# Patient Record
Sex: Male | Born: 1970 | Race: Black or African American | Hispanic: No | Smoking: Former smoker
Health system: Southern US, Community
[De-identification: ages and names within clinical notes are randomized; demographics above are authoritative.]

## PROBLEM LIST (undated history)

## (undated) DIAGNOSIS — I10 Essential (primary) hypertension: Secondary | ICD-10-CM

## (undated) DIAGNOSIS — K649 Unspecified hemorrhoids: Secondary | ICD-10-CM

## (undated) DIAGNOSIS — K317 Polyp of stomach and duodenum: Secondary | ICD-10-CM

## (undated) HISTORY — DX: Unspecified hemorrhoids: K64.9

## (undated) HISTORY — DX: Polyp of stomach and duodenum: K31.7

## (undated) HISTORY — PX: HERNIA REPAIR: SHX51

## (undated) HISTORY — PX: HYDROCELE EXCISION: SHX482

---

## 2012-08-25 ENCOUNTER — Emergency Department (HOSPITAL_BASED_OUTPATIENT_CLINIC_OR_DEPARTMENT_OTHER)
Admission: EM | Admit: 2012-08-25 | Discharge: 2012-08-25 | Disposition: A | Discharge status: Home / Self Care | Payer: Self-pay | Attending: Emergency Medicine | Admitting: Emergency Medicine

## 2012-08-25 ENCOUNTER — Encounter (HOSPITAL_BASED_OUTPATIENT_CLINIC_OR_DEPARTMENT_OTHER): Payer: Self-pay | Admitting: *Deleted

## 2012-08-25 ENCOUNTER — Emergency Department (HOSPITAL_BASED_OUTPATIENT_CLINIC_OR_DEPARTMENT_OTHER): Payer: Self-pay

## 2012-08-25 DIAGNOSIS — M65849 Other synovitis and tenosynovitis, unspecified hand: Secondary | ICD-10-CM | POA: Insufficient documentation

## 2012-08-25 DIAGNOSIS — M2548 Effusion, other site: Secondary | ICD-10-CM | POA: Insufficient documentation

## 2012-08-25 DIAGNOSIS — M65839 Other synovitis and tenosynovitis, unspecified forearm: Secondary | ICD-10-CM | POA: Insufficient documentation

## 2012-08-25 DIAGNOSIS — M2550 Pain in unspecified joint: Secondary | ICD-10-CM | POA: Insufficient documentation

## 2012-08-25 DIAGNOSIS — M778 Other enthesopathies, not elsewhere classified: Secondary | ICD-10-CM

## 2012-08-25 MED ORDER — IBUPROFEN 800 MG PO TABS: 800.0000 mg | ORAL_TABLET | Freq: Three times a day (TID) | ORAL | Status: DC

## 2012-08-25 NOTE — ED Notes (Signed)
Pt c/o left wrist/hand injury x 2 days ago

## 2012-08-25 NOTE — ED Provider Notes (Signed)
History     CSN: [id number]  Arrival date & time 08/25/12  1300   First MD Initiated Contact with Patient 08/25/12 1443      Chief Complaint  Patient presents with  . Wrist Pain    (Consider location/radiation/quality/duration/timing/severity/associated sxs/prior treatment) HPI Comments: Colin Cole, who is ambidextrous,  presents with a 2 day history of left thumb and wrist pain since working on his car.  He denies any specific injury but reports slight swelling at the base of his thumb and increased pain with attempts to make a fist or extend the left thumb, mostly,  But also the index finger.  He describes left wrist fracture from 2006 which has not caused discomfort in several years.  He has taken no medications prior to arrival.  He denies numbness distal to the site of pain.  The history is provided by the patient.    History reviewed. No pertinent past medical history.  Past Surgical History  Procedure Date  . Hernia repair     History reviewed. No pertinent family history.  History  Substance Use Topics  . Smoking status: Never Smoker   . Smokeless tobacco: Not on file  . Alcohol Use: No      Review of Systems  Musculoskeletal: Positive for joint swelling and arthralgias.  Skin: Negative for wound.  Neurological: Negative for weakness and numbness.    Allergies  Asa  Home Medications   Current Outpatient Rx  Name  Route  Sig  Dispense  Refill  . IBUPROFEN 800 MG PO TABS   Oral   Take 1 tablet (800 mg total) by mouth 3 (three) times daily.   21 tablet   0     BP 152/96  Pulse 78  Temp 98.5 F (36.9 C) (Oral)  Resp 16  Ht 6\' 1"  (1.854 m)  Wt 250 lb (113.399 kg)  BMI 32.98 kg/m2  SpO2 100%  Physical Exam  Constitutional: He appears well-developed and well-nourished.  HENT:  Head: Atraumatic.  Neck: Normal range of motion.  Cardiovascular:       Pulses equal bilaterally  Musculoskeletal: He exhibits edema. He exhibits no tenderness.       Left hand: He exhibits normal capillary refill and no deformity.       Hands:      Patient has no ttp with palpation of the left finger or hand.  He does have pain along the left thumb extensor tendon which is worsened with resistance.  No erythema, slight edema noted.  No skin abrasions or visible injury.  Neurological: He is alert. He has normal strength. He displays normal reflexes. No sensory deficit.       Equal strength  Skin: Skin is warm and dry.  Psychiatric: He has a normal mood and affect.    ED Course  Procedures (including critical care time)  Labs Reviewed - No data to display Dg Wrist Complete Left  08/25/2012  *RADIOLOGY REPORT*  Clinical Data: Left wrist pain for 2 days  LEFT WRIST - COMPLETE 3+ VIEW  Comparison: None.  Findings: Four views of the left wrist submitted.  No acute fracture or subluxation.  Small bony fragment adjacent to tip of distal radius is well corticated probable due to prior injury.  IMPRESSION: No acute fracture or subluxation.   Original Report Authenticated By: Natasha Mead, M.D.      1. Tendonitis of wrist, left       MDM  xrays reviewed.  Pt placed in thumb  spica, encouraged heat tx,  Ibuprofen which was prescribed.  Fu with pcp or Dr. Pearletha Forge,  Referral given,  If not improved over the next week.        Burgess Amor, [place] 08/25/12 1630

## 2012-08-27 NOTE — ED Provider Notes (Signed)
Medical screening examination/treatment/procedure(s) were performed by non-physician practitioner and as supervising physician I was immediately available for consultation/collaboration.  Juliet Rude. Rubin Payor, MD 08/27/12 [phone]

## 2013-02-18 ENCOUNTER — Emergency Department (HOSPITAL_BASED_OUTPATIENT_CLINIC_OR_DEPARTMENT_OTHER)
Admission: EM | Admit: 2013-02-18 | Discharge: 2013-02-18 | Disposition: A | Discharge status: Home Health | Payer: Medicaid Other | Attending: Emergency Medicine | Admitting: Emergency Medicine

## 2013-02-18 ENCOUNTER — Encounter (HOSPITAL_BASED_OUTPATIENT_CLINIC_OR_DEPARTMENT_OTHER): Payer: Self-pay | Admitting: Emergency Medicine

## 2013-02-18 ENCOUNTER — Encounter (HOSPITAL_BASED_OUTPATIENT_CLINIC_OR_DEPARTMENT_OTHER): Payer: Self-pay | Admitting: Family Medicine

## 2013-02-18 ENCOUNTER — Emergency Department (HOSPITAL_BASED_OUTPATIENT_CLINIC_OR_DEPARTMENT_OTHER)
Admission: EM | Admit: 2013-02-18 | Discharge: 2013-02-19 | Disposition: A | Discharge status: Home / Self Care | Payer: Medicaid Other | Attending: Emergency Medicine | Admitting: Emergency Medicine

## 2013-02-18 DIAGNOSIS — B86 Scabies: Secondary | ICD-10-CM | POA: Insufficient documentation

## 2013-02-18 DIAGNOSIS — L299 Pruritus, unspecified: Secondary | ICD-10-CM | POA: Insufficient documentation

## 2013-02-18 DIAGNOSIS — T3795XA Adverse effect of unspecified systemic anti-infective and antiparasitic, initial encounter: Secondary | ICD-10-CM | POA: Insufficient documentation

## 2013-02-18 DIAGNOSIS — Z8619 Personal history of other infectious and parasitic diseases: Secondary | ICD-10-CM | POA: Insufficient documentation

## 2013-02-18 DIAGNOSIS — T7840XA Allergy, unspecified, initial encounter: Secondary | ICD-10-CM

## 2013-02-18 MED ORDER — PERMETHRIN 5 % EX CREA: TOPICAL_CREAM | CUTANEOUS | Status: DC

## 2013-02-18 NOTE — ED Notes (Signed)
Pt states itching which started last night; states hands are swollen; states rash to back, hands and  Arms. States he was bitten by bugs. No distinct rash or bug bites visible.

## 2013-02-18 NOTE — ED Provider Notes (Signed)
   History    CSN: [id number] Arrival date & time 02/18/13  2159  First MD Initiated Contact with Patient 02/18/13 2356     Chief Complaint  Patient presents with  . Allergic Reaction   (Consider location/radiation/quality/duration/timing/severity/associated sxs/prior Treatment) HPI This is a 42 year old male who was seen here earlier today for a rash and was diagnosed as scabies. He was prescribed Prometrium. He applied the Prometrium as instructed between 5 and 6 PM. About an hour later he developed generalized itching, primarily in his hands. He did not take anything for the itching but did run his hands under hot water which helped him transiently. He continues to have generalized itching which is mild to moderate severity. Is also having some generalized hives. He denies any breathing difficulty, throat swelling, nausea, vomiting or diarrhea.  History reviewed. No pertinent past medical history. Past Surgical History  Procedure Laterality Date  . Hernia repair     History reviewed. No pertinent family history. History  Substance Use Topics  . Smoking status: Never Smoker   . Smokeless tobacco: Not on file  . Alcohol Use: Yes    Review of Systems  All other systems reviewed and are negative.    Allergies  Asa  Home Medications   Current Outpatient Rx  Name  Route  Sig  Dispense  Refill  . permethrin (ELIMITE) 5 % cream      Apply to entire body once, leave on for 8-14hrs before washing off.   60 g   0   . ibuprofen (ADVIL,MOTRIN) 800 MG tablet   Oral   Take 1 tablet (800 mg total) by mouth 3 (three) times daily.   21 tablet   0    BP 142/101  Pulse 76  Temp(Src) 97.9 F (36.6 C) (Oral)  Resp 20  SpO2 97%  Physical Exam General: Well-developed, well-nourished male in no acute distress; appearance consistent with age of record HENT: normocephalic, atraumatic Eyes: pupils equal round and reactive to light; extraocular muscles intact Neck: supple Heart:  regular rate and rhythm Lungs: clear to auscultation bilaterally Abdomen: soft; nondistended; nontender; bowel sounds present Extremities: No deformity; full range of motion Neurologic: Awake, alert and oriented; motor function intact in all extremities and symmetric; no facial droop Skin: Warm and dry; scattered sparse urticaria; no scabiform rash appreciated Psychiatric: Normal mood and affect    ED Course  Procedures (including critical care time)   MDM  Suspect allergic reaction to the permethrin. He was advised to wash it off at 2 AM to assure treatment of scabies. We will treat with antihistamine for symptoms.  Hanley Seamen, MD 02/19/13 [phone]

## 2013-02-18 NOTE — ED Provider Notes (Signed)
   History    CSN: [id number] Arrival date & time 02/18/13  1036  First MD Initiated Contact with Patient 02/18/13 1118     Chief Complaint  Patient presents with  . Rash   (Consider location/radiation/quality/duration/timing/severity/associated sxs/prior Treatment) Patient is a 42 y.o. male presenting with rash.  Rash  Pt reports itchy rash on hands since yesterday. Mostly in the webspaces, has also noticed itching in other areas that has since resolved. He was working outside yesterday on a car, and reports multiple mosquitoes around, but unsure if he was bitten.   History reviewed. No pertinent past medical history. Past Surgical History  Procedure Laterality Date  . Hernia repair     No family history on file. History  Substance Use Topics  . Smoking status: Never Smoker   . Smokeless tobacco: Not on file  . Alcohol Use: Yes    Review of Systems  Skin: Positive for rash.   All other systems reviewed and are negative except as noted in HPI.   Allergies  Asa  Home Medications   Current Outpatient Rx  Name  Route  Sig  Dispense  Refill  . ibuprofen (ADVIL,MOTRIN) 800 MG tablet   Oral   Take 1 tablet (800 mg total) by mouth 3 (three) times daily.   21 tablet   0    BP 156/89  Pulse 86  Temp(Src) 98.3 F (36.8 C) (Oral)  Resp 20  SpO2 98% Physical Exam  Constitutional: He is oriented to person, place, and time. He appears well-developed and well-nourished.  HENT:  Head: Normocephalic and atraumatic.  Neck: Neck supple.  Pulmonary/Chest: Effort normal.  Neurological: He is alert and oriented to person, place, and time. No cranial nerve deficit.  Skin: No rash noted.  No definite burros, but location is concerning for scabies  Psychiatric: He has a normal mood and affect. His behavior is normal.    ED Course  Procedures (including critical care time) Labs Reviewed - No data to display No results found. 1. Scabies     MDM  Plan scabies treatment  at home. Return for any worsening itching or new rash.   Yichen B. Bernette Mayers, MD 02/18/13 1136

## 2013-02-18 NOTE — ED Notes (Signed)
Pt was seen earlier today for what he thought were mosquito bites, was dx with scabes, given permethrin cream, pt used cream today and rash got worse, now presents with whelps down both arms, both legs and torso

## 2013-02-18 NOTE — ED Notes (Signed)
Pt c/o rash to hands and sts he thinks "insects bit him".

## 2013-02-19 MED ORDER — CETIRIZINE HCL 10 MG PO TABS: ORAL_TABLET | ORAL | Status: DC

## 2013-02-19 MED ORDER — CETIRIZINE HCL 5 MG/5ML PO SYRP
10.0000 mg | ORAL_SOLUTION | Freq: Once | ORAL | Status: AC
  Administered 2013-02-19: 10 mg via ORAL
  Filled 2013-02-19: qty 10

## 2013-02-19 MED ORDER — FAMOTIDINE 20 MG PO TABS
20.0000 mg | ORAL_TABLET | Freq: Once | ORAL | Status: AC
  Administered 2013-02-19: 20 mg via ORAL
  Filled 2013-02-19: qty 1

## 2013-02-19 NOTE — ED Notes (Signed)
MD at bedside. 

## 2013-05-11 ENCOUNTER — Ambulatory Visit (INDEPENDENT_AMBULATORY_CARE_PROVIDER_SITE_OTHER): Payer: Medicaid Other | Admitting: Podiatry

## 2013-05-11 ENCOUNTER — Encounter: Payer: Self-pay | Admitting: Podiatry

## 2013-05-11 VITALS — BP 140/83 | HR 70 | Ht 73.0 in | Wt 281.0 lb

## 2013-05-11 DIAGNOSIS — M79673 Pain in unspecified foot: Secondary | ICD-10-CM | POA: Insufficient documentation

## 2013-05-11 DIAGNOSIS — L600 Ingrowing nail: Secondary | ICD-10-CM

## 2013-05-11 DIAGNOSIS — M79609 Pain in unspecified limb: Secondary | ICD-10-CM

## 2013-05-11 DIAGNOSIS — B351 Tinea unguium: Secondary | ICD-10-CM

## 2013-05-11 NOTE — Patient Instructions (Addendum)
Seen for painful ingrown nails. Today all nails debrided and relieved pressure. May benefit from permanent procedure on right great toe lateral border. Set up appointment for the nail procedure.

## 2013-05-11 NOTE — Progress Notes (Signed)
Subjective: 42 y.o. year old male patient presents complaining of painful nails. Patient requests toe nails, corns and calluses trimmed.   Review of Systems - General ROS: negative for - chills, fatigue, fever, night sweats, sleep disturbance, weight gain or weight loss Ophthalmic ROS: Eyes are blurring. He will get them examined. ENT ROS: Sinus problems. 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 Neurological ROS: no TIA or stroke symptoms Dermatological ROS: negative.  Objective: Dermatologic: Thick yellow deformed nails are growing in on both great toes. Right great toe is painful. There is no open skin and no erythema or edema.  Vascular: Pedal pulses are all palpable. No edema or erythema. Orthopedic: Bilateral bunion deformity. Neurologic: All epicritic and tactile sensations grossly intact.  Assessment: Dystrophic mycotic nails x 10. Ingrown nail both great toe with pain on right lateral border.  Treatment: All mycotic nails debrided. Pressure and pain was relieved. May return for nail procedure P&A on right lateral border.

## 2013-05-17 ENCOUNTER — Encounter: Payer: Self-pay | Admitting: Podiatry

## 2013-05-17 ENCOUNTER — Ambulatory Visit (INDEPENDENT_AMBULATORY_CARE_PROVIDER_SITE_OTHER): Payer: Medicaid Other | Admitting: Podiatry

## 2013-05-17 VITALS — BP 138/84 | HR 73 | Ht 73.0 in | Wt 281.0 lb

## 2013-05-17 DIAGNOSIS — L600 Ingrowing nail: Secondary | ICD-10-CM

## 2013-05-17 DIAGNOSIS — M79609 Pain in unspecified limb: Secondary | ICD-10-CM

## 2013-05-17 NOTE — Patient Instructions (Addendum)
Permanent ingrown nail surgery done on right great toe lateral border. Follow instruction sheet and soak daily. Return next week.

## 2013-05-17 NOTE — Progress Notes (Signed)
42 year old male presents to have ingrown nail surgery right great toe. Consent form reviewed.  Procedure: Phenol and Alcohol matrixectomy right great toe lateral border.  Right great toe anesthetized with mixture of 1% Xylocaine with epinephrine and 0.5% Marcaine plain total 5ml. Lateral border nail removed and cauterized nail with phenol. Area neutralized with Alcohol and Amerigel ointment applied. Written and verbal instruction given to patient.

## 2013-05-24 ENCOUNTER — Encounter: Payer: Self-pay | Admitting: Podiatry

## 2013-05-24 ENCOUNTER — Ambulatory Visit (INDEPENDENT_AMBULATORY_CARE_PROVIDER_SITE_OTHER): Payer: Medicaid Other | Admitting: Podiatry

## 2013-05-24 VITALS — BP 145/89 | HR 75 | Ht 73.0 in | Wt 278.0 lb

## 2013-05-24 DIAGNOSIS — L600 Ingrowing nail: Secondary | ICD-10-CM

## 2013-05-24 NOTE — Progress Notes (Signed)
1 week follow up on right great toe ingrown nail surgery. Came in with fresh dressing on right great toe. Healing well without infection or inflammation. Continue to soak till pain stops.

## 2013-05-24 NOTE — Patient Instructions (Signed)
Follow up on nail surgery right great toe. Seen no sign of infection or inflammation. Continue to soak till pain stops.

## 2013-05-27 ENCOUNTER — Telehealth: Payer: Self-pay | Admitting: *Deleted

## 2013-05-27 NOTE — Telephone Encounter (Signed)
Dr. Verlee Rossetti called this am requesting could you call in Terbinafine for foot fungus. He gets his med at [facility] and [facility]

## 2013-06-01 ENCOUNTER — Telehealth: Payer: Self-pay | Admitting: *Deleted

## 2013-06-01 MED ORDER — TERBINAFINE HCL 1 % EX SOLN: 1.0000 [drp] | Freq: Every evening | CUTANEOUS | Status: DC

## 2013-06-01 NOTE — Telephone Encounter (Signed)
Call from [facility] regarding Mr. Gillen Rx for Terbinafine, doesn't come in solution, only in cream, tablet or spray. Told Pharmacist to use cream.

## 2013-08-11 DIAGNOSIS — K317 Polyp of stomach and duodenum: Secondary | ICD-10-CM

## 2013-08-11 HISTORY — DX: Polyp of stomach and duodenum: K31.7

## 2013-10-30 ENCOUNTER — Encounter (HOSPITAL_BASED_OUTPATIENT_CLINIC_OR_DEPARTMENT_OTHER): Payer: Self-pay | Admitting: Emergency Medicine

## 2013-10-30 ENCOUNTER — Emergency Department (HOSPITAL_BASED_OUTPATIENT_CLINIC_OR_DEPARTMENT_OTHER)
Admission: EM | Admit: 2013-10-30 | Discharge: 2013-10-30 | Disposition: A | Discharge status: Home / Self Care | Payer: No Typology Code available for payment source | Attending: Emergency Medicine | Admitting: Emergency Medicine

## 2013-10-30 DIAGNOSIS — M546 Pain in thoracic spine: Secondary | ICD-10-CM | POA: Insufficient documentation

## 2013-10-30 DIAGNOSIS — R51 Headache: Secondary | ICD-10-CM | POA: Insufficient documentation

## 2013-10-30 DIAGNOSIS — M62838 Other muscle spasm: Secondary | ICD-10-CM

## 2013-10-30 DIAGNOSIS — I10 Essential (primary) hypertension: Secondary | ICD-10-CM | POA: Insufficient documentation

## 2013-10-30 DIAGNOSIS — Z79899 Other long term (current) drug therapy: Secondary | ICD-10-CM | POA: Insufficient documentation

## 2013-10-30 DIAGNOSIS — G8911 Acute pain due to trauma: Secondary | ICD-10-CM | POA: Insufficient documentation

## 2013-10-30 DIAGNOSIS — Z87891 Personal history of nicotine dependence: Secondary | ICD-10-CM | POA: Insufficient documentation

## 2013-10-30 HISTORY — DX: Essential (primary) hypertension: I10

## 2013-10-30 MED ORDER — NAPROXEN 500 MG PO TABS: 500.0000 mg | ORAL_TABLET | Freq: Two times a day (BID) | ORAL | Status: DC

## 2013-10-30 MED ORDER — CYCLOBENZAPRINE HCL 10 MG PO TABS: 10.0000 mg | ORAL_TABLET | Freq: Two times a day (BID) | ORAL | Status: DC | PRN

## 2013-10-30 NOTE — Discharge Instructions (Signed)
If you were given medicines take as directed.  If you are on coumadin or contraceptives realize their levels and effectiveness is altered by many different medicines.  If you have any reaction (rash, tongues swelling, other) to the medicines stop taking and see a physician.   Please follow up as directed and return to the ER or see a physician for new or worsening symptoms.  Thank you. Filed Vitals:   10/30/13 1136  BP: 161/111  Pulse: 88  Temp: 98.6 F (37 C)  TempSrc: Oral  Resp: 18  Height: 6\' 1"  (1.854 m)  Weight: 270 lb (122.471 kg)  SpO2: 94%   Do not take both ibuprofen and naproxen as they work the same way.
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
## 2014-05-27 ENCOUNTER — Encounter (HOSPITAL_BASED_OUTPATIENT_CLINIC_OR_DEPARTMENT_OTHER): Payer: Self-pay | Admitting: Emergency Medicine

## 2014-05-27 ENCOUNTER — Emergency Department (HOSPITAL_BASED_OUTPATIENT_CLINIC_OR_DEPARTMENT_OTHER): Payer: Medicaid Other

## 2014-05-27 ENCOUNTER — Emergency Department (HOSPITAL_BASED_OUTPATIENT_CLINIC_OR_DEPARTMENT_OTHER)
Admission: EM | Admit: 2014-05-27 | Discharge: 2014-05-27 | Disposition: A | Discharge status: Home / Self Care | Payer: Medicaid Other | Attending: Emergency Medicine | Admitting: Emergency Medicine

## 2014-05-27 DIAGNOSIS — K625 Hemorrhage of anus and rectum: Secondary | ICD-10-CM

## 2014-05-27 DIAGNOSIS — Z7952 Long term (current) use of systemic steroids: Secondary | ICD-10-CM | POA: Insufficient documentation

## 2014-05-27 DIAGNOSIS — Z79899 Other long term (current) drug therapy: Secondary | ICD-10-CM | POA: Insufficient documentation

## 2014-05-27 DIAGNOSIS — K921 Melena: Secondary | ICD-10-CM | POA: Insufficient documentation

## 2014-05-27 DIAGNOSIS — Z791 Long term (current) use of non-steroidal anti-inflammatories (NSAID): Secondary | ICD-10-CM | POA: Insufficient documentation

## 2014-05-27 DIAGNOSIS — I10 Essential (primary) hypertension: Secondary | ICD-10-CM | POA: Insufficient documentation

## 2014-05-27 DIAGNOSIS — Z9889 Other specified postprocedural states: Secondary | ICD-10-CM | POA: Insufficient documentation

## 2014-05-27 DIAGNOSIS — Z87891 Personal history of nicotine dependence: Secondary | ICD-10-CM | POA: Insufficient documentation

## 2014-05-27 LAB — COMPREHENSIVE METABOLIC PANEL
ALT: 18 U/L (ref 0–53)
AST: 20 U/L (ref 0–37)
Albumin: 4 g/dL (ref 3.5–5.2)
Alkaline Phosphatase: 106 U/L (ref 39–117)
Anion gap: 12 (ref 5–15)
BUN: 8 mg/dL (ref 6–23)
CALCIUM: 9.4 mg/dL (ref 8.4–10.5)
CO2: 29 meq/L (ref 19–32)
Chloride: 99 mEq/L (ref 96–112)
Creatinine, Ser: 0.9 mg/dL (ref 0.50–1.35)
GFR calc Af Amer: >90 mL/min (ref >90)
Glucose, Bld: 96 mg/dL (ref 70–99)
Potassium: 3.5 mEq/L — ABNORMAL LOW (ref 3.7–5.3)
SODIUM: 140 meq/L (ref 137–147)
TOTAL PROTEIN: 8.5 g/dL — AB (ref 6.0–8.3)
Total Bilirubin: 0.3 mg/dL (ref 0.3–1.2)

## 2014-05-27 LAB — CBC WITH DIFFERENTIAL/PLATELET
BASOS ABS: 0 10*3/uL (ref 0.0–0.1)
Basophils Relative: 0 % (ref 0–1)
EOS PCT: 3 % (ref 0–5)
Eosinophils Absolute: 0.2 10*3/uL (ref 0.0–0.7)
HCT: 45.1 % (ref 39.0–52.0)
Hemoglobin: 14.7 g/dL (ref 13.0–17.0)
LYMPHS PCT: 55 % — AB (ref 12–46)
Lymphs Abs: 2.7 10*3/uL (ref 0.7–4.0)
MCH: 24.7 pg — ABNORMAL LOW (ref 26.0–34.0)
MCHC: 32.6 g/dL (ref 30.0–36.0)
MCV: 75.7 fL — AB (ref 78.0–100.0)
Monocytes Absolute: 0.7 10*3/uL (ref 0.1–1.0)
Monocytes Relative: 14 % — ABNORMAL HIGH (ref 3–12)
NEUTROS PCT: 28 % — AB (ref 43–77)
Neutro Abs: 1.4 10*3/uL — ABNORMAL LOW (ref 1.7–7.7)
PLATELETS: 227 10*3/uL (ref 150–400)
RBC: 5.96 MIL/uL — ABNORMAL HIGH (ref 4.22–5.81)
RDW: 14.8 % (ref 11.5–15.5)
WBC: 5 10*3/uL (ref 4.0–10.5)

## 2014-05-27 LAB — OCCULT BLOOD X 1 CARD TO LAB, STOOL: Fecal Occult Bld: POSITIVE — AB

## 2014-05-27 MED ORDER — IOHEXOL 300 MG/ML  SOLN
100.0000 mL | Freq: Once | INTRAMUSCULAR | Status: AC | PRN
  Administered 2014-05-27: 100 mL via INTRAVENOUS

## 2014-05-27 MED ORDER — DOCUSATE SODIUM 100 MG PO CAPS: 100.0000 mg | ORAL_CAPSULE | Freq: Two times a day (BID) | ORAL | Status: DC

## 2014-05-27 MED ORDER — HYDROCORTISONE 2.5 % RE CREA: TOPICAL_CREAM | RECTAL | Status: DC

## 2014-05-27 MED ORDER — IOHEXOL 300 MG/ML  SOLN
50.0000 mL | Freq: Once | INTRAMUSCULAR | Status: AC | PRN
  Administered 2014-05-27: 50 mL via ORAL

## 2014-05-27 NOTE — ED Notes (Signed)
Pt  Discharged to home with family. NAD.

## 2014-05-27 NOTE — ED Notes (Signed)
Pt reports one week history of bright red blood in stools. Denies pain, denies feeling lightheaded or dizzy.

## 2014-05-27 NOTE — ED Provider Notes (Addendum)
CSN: [id number]     Arrival date & time 05/27/14  2585 History  This chart was scribed for Ezequiel Essex, MD by Peyton Bottoms, ED Scribe. This patient was seen in room MH08/MH08 and the patient's care was started at 7:08 PM.   Chief Complaint  Patient presents with  . Rectal Bleeding   Patient is a 43 y.o. male presenting with hematochezia. The history is provided by the patient. No language interpreter was used.  Rectal Bleeding  HPI Comments: Colin Cole is a 43 y.o. male with a history of hemorrhoids, who presents to the Emergency Department complaining of hematochezia that began 1 week ago. Patient states that the blood is dark red and looks like clots. Patient is here today due to increased frequency of hematochezia. He states that he has blood in his stool everyday for the past few days. Patient states that the bleeding turns the toilet water red. Patient denies any bleeding without a bowel movement. Patient denies pain with having bowel movement. Patient currently takes BP medications. Patient currently denies associated emesis, CP, SOB or dizziness. No blood thinner use.   Past Medical History  Diagnosis Date  . Hypertension    Past Surgical History  Procedure Laterality Date  . Hernia repair    . Hydrocele excision     History reviewed. No pertinent family history. History  Substance Use Topics  . Smoking status: Former Research scientist (life sciences)  . Smokeless tobacco: Never Used     Comment: Quit in 2010  . Alcohol Use: Yes   Review of Systems  Gastrointestinal: Positive for hematochezia.   A complete 10 system review of systems was obtained and all systems are negative except as noted in the HPI and PMH.  Allergies  Asa and Terbinafine and related  Home Medications   Prior to Admission medications   Medication Sig Start Date End Date Taking? Authorizing Provider  hydrochlorothiazide (MICROZIDE) 12.5 MG capsule Take 12.5 mg by mouth daily.   Yes Historical Provider, MD   cyclobenzaprine (FLEXERIL) 10 MG tablet Take 1 tablet (10 mg total) by mouth 2 (two) times daily as needed for muscle spasms. 10/30/13   Mariea Clonts, MD  docusate sodium (COLACE) 100 MG capsule Take 1 capsule (100 mg total) by mouth every 12 (twelve) hours. 05/27/14   Ezequiel Essex, MD  hydrocortisone (ANUSOL-HC) 2.5 % rectal cream Apply rectally 2 times daily 05/27/14   Ezequiel Essex, MD  naproxen (NAPROSYN) 500 MG tablet Take 1 tablet (500 mg total) by mouth 2 (two) times daily. 10/30/13   Mariea Clonts, MD   Triage Vitals: BP 159/91  Pulse 98  Temp(Src) 97.7 F (36.5 C) (Oral)  Resp 20  Ht 6\' 1"  (1.854 m)  Wt 287 lb (130.182 kg)  BMI 37.87 kg/m2  SpO2 97%  Physical Exam  Nursing note and vitals reviewed. Constitutional: He is oriented to person, place, and time. He appears well-developed and well-nourished. No distress.  HENT:  Head: Normocephalic and atraumatic.  Mouth/Throat: Oropharynx is clear and moist. No oropharyngeal exudate.  Eyes: Conjunctivae and EOM are normal. Pupils are equal, round, and reactive to light.  Neck: Normal range of motion. Neck supple.  No meningismus.  Cardiovascular: Normal rate, regular rhythm, normal heart sounds and intact distal pulses.   No murmur heard. Pulmonary/Chest: Effort normal and breath sounds normal. No respiratory distress.  Abdominal: Soft. There is no tenderness. There is no rebound and no guarding.  Genitourinary:  Small external hemorrhoids. No gross blood. No stool  in rectal vault. Chaperone present.  Musculoskeletal: Normal range of motion. He exhibits no edema and no tenderness.  Neurological: He is alert and oriented to person, place, and time. No cranial nerve deficit. He exhibits normal muscle tone. Coordination normal.  No ataxia on finger to nose bilaterally. No pronator drift. 5/5 strength throughout. CN 2-12 intact. Negative Romberg. Equal grip strength. Sensation intact. Gait is normal.   Skin: Skin is warm.   Psychiatric: He has a normal mood and affect. His behavior is normal.   ED Course  Procedures (including critical care time)  DIAGNOSTIC STUDIES: Oxygen Saturation is 97% on RA, normal by my interpretation.    COORDINATION OF CARE: 7:16 PM- Discussed plans to order diagnostic lab work. Pt advised of plan for treatment and pt agrees.  Labs Review Labs Reviewed  CBC WITH DIFFERENTIAL - Abnormal; Notable for the following:    RBC 5.96 (*)    MCV 75.7 (*)    MCH 24.7 (*)    Neutrophils Relative % 28 (*)    Neutro Abs 1.4 (*)    Lymphocytes Relative 55 (*)    Monocytes Relative 14 (*)    All other components within normal limits  COMPREHENSIVE METABOLIC PANEL - Abnormal; Notable for the following:    Potassium 3.5 (*)    Total Protein 8.5 (*)    All other components within normal limits  OCCULT BLOOD X 1 CARD TO LAB, STOOL - Abnormal; Notable for the following:    Fecal Occult Bld POSITIVE (*)    All other components within normal limits    Imaging Review Ct Abdomen Pelvis W Contrast  05/27/2014   CLINICAL DATA:  Blood in the stools for the past few days. History of hemorrhoids. Previous hernia repair and hydrocele excision.  EXAM: CT ABDOMEN AND PELVIS WITH CONTRAST  TECHNIQUE: Multidetector CT imaging of the abdomen and pelvis was performed using the standard protocol following bolus administration of intravenous contrast.  CONTRAST:  137mL OMNIPAQUE IOHEXOL 300 MG/ML  SOLN  COMPARISON:  None.  FINDINGS: Normal appearing liver, spleen, pancreas, gallbladder, adrenal glands, right kidney, urinary bladder and prostate gland. Small lower pole left renal cyst. No gastrointestinal abnormalities or enlarged lymph nodes. Normal appearing appendix. Oval, smoothly marginated fat density mass in the left flank abdominal wall musculature at the level of the left kidney. This measures 5.2 x 1.9 cm on image number 44. Small amount of lingular atelectasis or scarring. Mild scoliosis.  IMPRESSION:  1. No acute abnormality. 2. 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   Electronically Signed   By: Enrique Sack M.D.   On: 05/27/2014 21:32     EKG Interpretation None     MDM   Final diagnoses:  Rectal bleeding  intermittent rectal bleeding for the past one week. No focal abdominal pain. No chest pain, dizziness or lightheadedness.  Hemoglobin 14. Fecal occult positive without gross blood. Small external hemorrhoids. Orthostatics negative.  CT scan shows no source of rectal bleeding. Suspect hemorrhoidal versus diverticular. Patient is stable for outpatient followup. We'll start stool softener as well as Anusol cream. Needs to established care with PCP. Resource guide given   BP 121/78  Pulse 74  Temp(Src) 97.7 F (36.5 C) (Oral)  Resp 20  Ht 6\' 1"  (1.854 m)  Wt 287 lb (130.182 kg)  BMI 37.87 kg/m2  SpO2 100%  I personally performed the services described in this documentation, which was scribed in my presence. The recorded information has been reviewed  and is accurate.  Ezequiel Essex, MD 05/27/14 [place], MD 05/28/14 [phone]

## 2014-05-27 NOTE — Discharge Instructions (Signed)
Bloody Stools Bloody stools often mean that there is a problem in the digestive tract. Your caregiver may use the term "melena" to describe black, tarry, and bad smelling stools or "hematochezia" to describe red or maroon-colored stools. Blood seen in the stool can be caused by bleeding anywhere along the intestinal tract.  A black stool usually means that blood is coming from the upper part of the gastrointestinal tract (esophagus, stomach, or small bowel). Passing maroon-colored stools or bright red blood usually means that blood is coming from lower down in the large bowel or the rectum. However, sometimes massive bleeding in the stomach or small intestine can cause bright red bloody stools.  Consuming black licorice, lead, iron pills, medicines containing bismuth subsalicylate, or blueberries can also cause black stools. Your caregiver can test black stools to see if blood is present. It is important that the cause of the bleeding be found. Treatment can then be started, and the problem can be corrected. Rectal bleeding may not be serious, but you should not assume everything is okay until you know the cause.It is very important to follow up with your caregiver or a specialist in gastrointestinal problems. CAUSES  Blood in the stools can come from various underlying causes.Often, the cause is not found during your first visit. Testing is often needed to discover the cause of bleeding in the gastrointestinal tract. Causes range from simple to serious or even life-threatening.Possible causes include:  Hemorrhoids.These are veins that are full of blood (engorged) in the rectum. They cause pain, inflammation, and may bleed.  Anal fissures.These are areas of painful tearing which may bleed. They are often caused by passing hard stool.  Diverticulosis.These are pouches that form on the colon over time, with age, and may bleed significantly.  Diverticulitis.This is inflammation in areas with  diverticulosis. It can cause pain, fever, and bloody stools, although bleeding is rare.  Proctitis and colitis. These are inflamed areas of the rectum or colon. They may cause pain, fever, and bloody stools.  Polyps and cancer. Colon cancer is a leading cause of preventable cancer death.It often starts out as precancerous polyps that can be removed during a colonoscopy, preventing progression into cancer. Sometimes, polyps and cancer may cause rectal bleeding.  Gastritis and ulcers.Bleeding from the upper gastrointestinal tract (near the stomach) may travel through the intestines and produce black, sometimes tarry, often bad smelling stools. In certain cases, if the bleeding is fast enough, the stools may not be black, but red and the condition may be life-threatening. SYMPTOMS  You may have stools that are bright red and bloody, that are normal color with blood on them, or that are dark black and tarry. In some cases, you may only have blood in the toilet bowl. Any of these cases need medical care. You may also have:  Pain at the anus or anywhere in the rectum.  Lightheadedness or feeling faint.  Extreme weakness.  Nausea or vomiting.  Fever. DIAGNOSIS Your caregiver may use the following methods to find the cause of your bleeding:  Taking a medical history. Age is important. Older people tend to develop polyps and cancer more often. If there is anal pain and a hard, large stool associated with bleeding, a tear of the anus may be the cause. If blood drips into the toilet after a bowel movement, bleeding hemorrhoids may be the problem. The color and frequency of the bleeding are additional considerations. In most cases, the medical history provides clues, but seldom the final  answer. °· A visual and finger (digital) exam. Your caregiver will inspect the anal area, looking for tears and hemorrhoids. A finger exam can provide information when there is tenderness or a growth inside. In men, the  prostate is also examined. °· Endoscopy. Several types of small, long scopes (endoscopes) are used to view the colon. °¨ In the office, your caregiver may use a rigid, or more commonly, a flexible viewing sigmoidoscope. This exam is called flexible sigmoidoscopy. It is performed in 5 to 10 minutes. °¨ A more thorough exam is accomplished with a colonoscope. It allows your caregiver to view the entire 5 to 6 foot long colon. Medicine to help you relax (sedative) is usually given for this exam. Frequently, a bleeding lesion may be present beyond the reach of the sigmoidoscope. So, a colonoscopy may be the best exam to start with. Both exams are usually done on an outpatient basis. This means the patient does not stay overnight in the hospital or surgery center. °¨ An upper endoscopy may be needed to examine your stomach. Sedation is used and a flexible endoscope is put in your mouth, down to your stomach. °· A barium enema X-ray. This is an X-ray exam. It uses liquid barium inserted by enema into the rectum. This test alone may not identify an actual bleeding point. X-rays highlight abnormal shadows, such as those made by lumps (tumors), diverticuli, or colitis. °TREATMENT  °Treatment depends on the cause of your bleeding.  °· For bleeding from the stomach or colon, the caregiver doing your endoscopy or colonoscopy may be able to stop the bleeding as part of the procedure. °· Inflammation or infection of the colon can be treated with medicines. °· Many rectal problems can be treated with creams, suppositories, or warm baths. °· Surgery is sometimes needed. °· Blood transfusions are sometimes needed if you have lost a lot of blood. °· For any bleeding problem, let your caregiver know if you take aspirin or other blood thinners regularly. °HOME CARE INSTRUCTIONS  °· Take any medicines exactly as prescribed. °· Keep your stools soft by eating a diet high in fiber. Prunes (1 to 3 a day) work well for many people. °· Drink  enough water and fluids to keep your urine clear or pale yellow. °· Take sitz baths if advised. A sitz bath is when you sit in a bathtub with warm water for 10 to 15 minutes to soak, soothe, and cleanse the rectal area. °· If enemas or suppositories are advised, be sure you know how to use them. Tell your caregiver if you have problems with this. °· Monitor your bowel movements to look for signs of improvement or worsening. °SEEK MEDICAL CARE IF:  °· You do not improve in the time expected. °· Your condition worsens after initial improvement. °· You develop any new symptoms. °SEEK IMMEDIATE MEDICAL CARE IF:  °· You develop severe or prolonged rectal bleeding. °· You vomit blood. °· You feel weak or faint. °· You have a fever. °MAKE SURE YOU: °· Understand these instructions. °· Will watch your condition. °· Will get help right away if you are not doing well or get worse. °Document Released: 07/18/2002 Document Revised: 10/20/2011 Document Reviewed: 12/13/2010 °ExitCare® Patient Information ©2015 [employer]. This information is not intended to replace advice given to you by your health care provider. Make sure you discuss any questions you have with your health care provider. ° °Emergency Department Resource Guide °1) Find a Doctor and Pay Out of   Pocket °Although you won't have to find out who is covered by your insurance plan, it is a good idea to ask around and get recommendations. You will then need to call the office and see if the doctor you have chosen will accept you as a new patient and what types of options they offer for patients who are self-pay. Some doctors offer discounts or will set up payment plans for their patients who do not have insurance, but you will need to ask so you aren't surprised when you get to your appointment. ° °2) Contact Your Local Health Department °Not all health departments have doctors that can see patients for sick visits, but many do, so it is worth a call to see if yours  does. If you don't know where your local health department is, you can check in your phone book. The CDC also has a tool to help you locate your state's health department, and many state websites also have listings of all of their local health departments. ° °3) Find a Walk-in Clinic °If your illness is not likely to be very severe or complicated, you may want to try a walk in clinic. These are popping up all over the country in pharmacies, drugstores, and shopping centers. They're usually staffed by nurse practitioners or physician assistants that have been trained to treat common illnesses and complaints. They're usually fairly quick and inexpensive. However, if you have serious medical issues or chronic medical problems, these are probably not your best option. ° °No Primary Care Doctor: °- Call [facility] at  832-8000 - they can help you locate a primary care doctor that  accepts your insurance, provides certain services, etc. °- Physician Referral Service- 1-800-533-3463 ° °Chronic Pain Problems: °Organization         Address  Phone   Notes  °[facility]  (336) 297-2271 Patients need to be referred by their primary care doctor.  ° °Medication Assistance: °Organization         Address  Phone   Notes  °[place] [facility] [address] °[place], [place] [address] (336) 641-8030 --Must be a resident of [place] °-- Must have NO insurance coverage whatsoever (no Medicaid/ Medicare, etc.) °-- The pt. MUST have a primary care doctor that directs their care regularly and follows them in the community °  °[facility]  (866) 331-1348   °[employer]  (888) 892-1162   ° °Agencies that provide inexpensive medical care: °Organization         Address  Phone   Notes  °[facility]  (336) 832-8035   °[facility]    (336) 832-7272   °[facility] [address] °[place], [place] [address] (336) 832-4777    °[facility] [address], °[place] (336) 271-4999   °[facility]    (336) 373-0678   °[facility]    (336) 272-1050   °[facility] ° [address], [place] Phone:  (336) 832-4444, Fax:  (336) 832-4440 Hours of Operation:  9 am - 6 pm, M-F.  Also accepts Medicaid/Medicare and self-pay.  °[facility] ° [address], [place] Phone: (336) 832-3150, Fax: (336) [phone]. Hours of Operation:  8:30 am - 5:30 pm, M-F.  Also accepts Medicaid and self-pay.  °[facility] [place] [address], [place] Phone: (336) 878-6027   °[facility] [address], [place], [place] (336)723-1848, Ext. 123 Mondays & Thursdays: 7-9 AM.  First 15 patients are seen on a first come, first serve basis. °  ° °Medicaid-accepting [place] Providers: ° °Organization         Address  Phone   Notes  °[facility] [address], [place] (336) 641-2100 Also accepts self-pay patients.  °[facility] [address], [place] ° (336) 856-9996   °[facility] [address], [place] (336) 288-8857   °[facility] [address] [place] [address], [place] (336) 299-7000   °Veita Bland [address], [place]  ° (336) 373-1557 Only accepts [place] Access Medicaid patients after they have their name applied to their card.  ° °Self-Pay (no insurance) in [place]: ° °Organization         Address  Phone   Notes  °Sickle Cell Patients, [facility] [address], [place] (336) 832-1970   °[facility] [address], [place] (336) 832-4400   °[facility] ° [address], [place] (336) 992-4800   °[facility]/Dr. Osei-Bonsu ° [address] [place] [address], [place] or [address], [place] (336)  [phone] Phone number for both [place] and [place] locations is the same.  °[facility] [address], [place] (336) 299-0000   °[facility] [address] [place] [address], [place] or [address] (336) 852-7530 °(336) 878-2260   °[facility] [address], [place] (336) 350-1642, phone; (336) 294-5005, fax Sees patients 1st and 3rd Saturday of every month.  Must not qualify for public or private insurance (i.e. Medicaid, Medicare, [place] Health Choice, Veterans' Benefits) • Household income should be no more than 200% of the poverty level •The clinic cannot treat you if you are pregnant or think you are pregnant • Sexually transmitted diseases are not treated at the clinic.  ° ° °Dental Care: °Organization         Address  Phone  Notes  °[facility] [address], [place] (336) 641-6152 Accepts children up to age 21 who are enrolled in Medicaid or [place] Health Choice; pregnant women with a Medicaid card; and children who have applied for Medicaid or [place] Health Choice, but were declined, whose parents can pay a reduced fee at time of service.  °[facility] [place]  [address], [place] (336) 641-7733 Accepts children up to age 21 who are enrolled in Medicaid or [place] Health Choice; pregnant women with a Medicaid card; and children who have applied for Medicaid or [place] Health Choice, but were declined, whose parents can pay a reduced fee at time of service.  °[facility] ° [address], [place] (336) 641-4533 Patients are seen by appointment only. Walk-ins are not accepted. [facility] will see patients 18 years of age and older. °Monday - Tuesday (8am-5pm) °Most Wednesdays (8:30-5pm) °$30 per visit, cash only  °[facility] ° [address], [place] (336) 641-4533 Patients are seen by  appointment only. Walk-ins are not accepted. [facility] will see patients 18 years of age and older. °One Wednesday Evening (Monthly: Volunteer Based).  $30 per visit, cash only  °  [facility]  (919) 537-3737 for adults; Children under age 4, call Graduate Pediatric Dentistry at (919) [phone]. Children aged 4-14, please call (919) [phone] to request a pediatric application. ° Dental services are provided in all areas of dental care including fillings, crowns and bridges, complete and partial dentures, implants, gum treatment, root canals, and extractions. Preventive care is also provided. Treatment is provided to both adults and children. °Patients are selected via a lottery and there is often a waiting list. °  °[facility] [address], °[place] ° (336) 763-8833 [email or web address] °  °[facility] [address], [place], [place] (336)723-1848, Ext. 123 Second and Fourth Thursday of each month, opens at 6:30 AM; Clinic ends at 9 AM.  Patients are seen on a first-come first-served basis, and a limited number are seen during each clinic.  ° °[facility] ° [address], [place], [place] (336) 723-7904   Eligibility Requirements °You must have lived in [place], [place], or [place] counties for at least the last three months. °  You cannot be eligible for state or federal sponsored healthcare insurance, including Veterans Administration, Medicaid, or Medicare. °  You generally cannot be eligible for healthcare insurance through your employer.  °  How to apply: °Eligibility screenings are held every Tuesday and Wednesday afternoon from 1:00 pm until 4:00 pm. You do not need an appointment for the interview!  °[facility] [address], [place], [place] 336-631-2330   °[facility]  336-342-8273   °[facility]  336-703-3100   °[facility]  336-570-6415    ° °Behavioral Health Resources in the Community: °Intensive Outpatient Programs °Organization         Address  Phone  Notes  °[facility] [address], [place], [place] 336-878-6098   °[facility] [address], [place], [place] 336-832-9800   °[facility] [address], [place], [place] ° 336-882-2125   °[facility] [address],  °[place], [place] 1-800-853-5163 or 336-641-4981   °Substance Abuse Resources °Organization         Address  Phone  Notes  °[facility]  336-882-2125   °[facility]  336-784-9470   °[facility]  336-285-9073   °[facility]  336-845-3988   °Residential & Outpatient Substance Abuse Program  1-800-659-3381   °Psychological Services °Organization         Address  Phone  Notes  °[facility]  336- 832-9600   °[facility]  336- 378-7881   °[facility] [address], [place] 1-800-853-5163 or 336-641-4981   ° °Mobile Crisis Teams °Organization         Address  Phone  Notes  °[facility], Mobile Crisis Care Unit  1-877-626-1772   °Assertive °[facility] ° [address] [place], [place] 336-834-9664   °Sharon DeEsch [address] °[place] 336-554-5454   ° °Self-Help/Support Groups °Organization         Address  Phone             Notes  °[facility]  - variety of support groups  336- 373-1402 Call for more information  °Narcotics Anonymous (NA), [facility] [address], °[place]  2 meetings at this location  ° °Residential Treatment Programs °Organization           Address  Phone  Notes  °[facility] [address],    °[place]  1-866-801-8205   °[facility] ° [address], [place], [place] 704-293-8524   °[facility] [address], [place] 336-845-3988 Admissions: 8am-3pm M-F  °[facility] [address]    °[place], [place] 336-841-1104   °[facility] [address], [place], [place] 336-379-7146   °[facility] [address]  °[place], [place] 336-285-9073   °[facility] Intensive Outpatient [address], [place], [place] 336-852-3033   °[facility] [address]  °[place], [place] 1-877-615-2722 or 336-784-9470   °[facility] [address] [place], [place] 336-227-7417 Accepts Medicaid  °[facility] [address]  °[place] 1-800-659-3381 Substance Abuse/Addiction Treatment  ° °[place] Behavioral Health Resources °Organization         Address  Phone  Notes  °[facility]  (888) 581-9988   °Julie Brannon, PhD [address] [place], [place]   (336) 349-5553 or (336) 951-0000   °[facility]   [address] °[place], [place] (336) 349-4454   °[facility] [address], [place], [place] (336) 342-8316 Insurance/Medicaid/sponsorship through [facility]  °[facility] [address]                                    [place], [place] (336) 342-8316 Therapy/tele-psych/case  °[facility] [address]  ° [place], [place] (336) 349-2233    °Dr. Arfeen  (336) 349-4544   °[facility] [place]  [employer] [place] [facility] 1) [address], [place] °2) [address], [place] °3)  [address], [place] (336) 349-3220 °(336) 342-7768 ° °(336) 342-8140   °[place] [facility] (336) 342-1394 or (336) 342-3537 (After Hours)    ° ° ° °

## 2014-06-05 ENCOUNTER — Encounter: Payer: Self-pay | Admitting: Family Medicine

## 2014-06-05 ENCOUNTER — Ambulatory Visit: Payer: Medicaid Other | Attending: Family Medicine | Admitting: Family Medicine

## 2014-06-05 VITALS — BP 161/91 | HR 80 | Temp 98.1°F | Resp 18 | Ht 73.0 in | Wt 293.0 lb

## 2014-06-05 DIAGNOSIS — K625 Hemorrhage of anus and rectum: Secondary | ICD-10-CM | POA: Insufficient documentation

## 2014-06-05 DIAGNOSIS — I10 Essential (primary) hypertension: Secondary | ICD-10-CM | POA: Insufficient documentation

## 2014-06-05 DIAGNOSIS — Z87891 Personal history of nicotine dependence: Secondary | ICD-10-CM | POA: Insufficient documentation

## 2014-06-05 LAB — HEMOCCULT GUIAC POC 1CARD (OFFICE): Fecal Occult Blood, POC: NEGATIVE

## 2014-06-05 MED ORDER — HYDROCHLOROTHIAZIDE 25 MG PO TABS: 25.0000 mg | ORAL_TABLET | Freq: Every day | ORAL | Status: DC

## 2014-06-05 NOTE — Assessment & Plan Note (Signed)
A: BP elevated above goal Meds: complaint P: increase HCTZ to 25 mg daily

## 2014-06-05 NOTE — Patient Instructions (Signed)
Peretti,   Thank you for coming in today. It was a pleasure meeting you. I look forward to being your primary doctor.   1. High blood pressure:  Please increase HCTZ to 25 mg daily.   2. Rectal bleeding: Do sitz baths.  Stool softeners Checking CBC today  No blood in stool currently   F/u in 2 weeks for RN BP check  F/u in 4 weeks with me.

## 2014-06-05 NOTE — Progress Notes (Signed)
Establish Care FU Stated no changes since hospital visit

## 2014-06-05 NOTE — Assessment & Plan Note (Signed)
A: x 3 weeks suspect internal hemorrhoids P: Sitz baths Stool softener F/u CBC Patient to apply for [place] discount may need gen surg referral vs GI for colonoscopy

## 2014-06-05 NOTE — Progress Notes (Signed)
   Subjective:    Patient ID: Colin Cole, male    DOB: [birth year], 43 y.o.   MRN: [id number] CC: ED f/u for rectal bleeding   HPI 43yo M presents with partner to establish care and discuss the following:  1. Rectal bleeding: x 3 weeks. Bright red. Painless. Patient reports seeing blood daily. No change. Taking stool softener. No associated fever, abdominal pain, nausea or emesis.   2. HTN: elevated BP. No HA, CP, SOB, LE edema. Not compliant with low salt diet. Taking HCTZ 12.5 mg daily.   Soc hx: former smoker  Med Hx: HTN 2010 Surg Hx: hernia repair  Review of Systems As per HPI     Objective:   Physical Exam BP 161/91  Pulse 80  Temp(Src) 98.1 F (36.7 C) (Oral)  Resp 18  Ht 6\' 1"  (1.854 m)  Wt 293 lb (132.904 kg)  BMI 38.67 kg/m2  SpO2 97% BP Readings from Last 3 Encounters:  06/05/14 161/91  05/27/14 121/78  10/30/13 161/111  General appearance: alert, cooperative and no distress Abdomen: soft, non-tender; bowel sounds normal; no masses,  no organomegaly Rectal: external skin tags noted on exam, no internal masses or nodules. no gross blood. normal prostate  FOBT: negative      Assessment & Plan:

## 2014-06-06 ENCOUNTER — Telehealth: Payer: Self-pay | Admitting: *Deleted

## 2014-06-06 LAB — CBC
HCT: 44.3 % (ref 39.0–52.0)
Hemoglobin: 14.6 g/dL (ref 13.0–17.0)
MCH: 24.5 pg — ABNORMAL LOW (ref 26.0–34.0)
MCHC: 33 g/dL (ref 30.0–36.0)
MCV: 74.5 fL — ABNORMAL LOW (ref 78.0–100.0)
Platelets: 246 10*3/uL (ref 150–400)
RBC: 5.95 MIL/uL — ABNORMAL HIGH (ref 4.22–5.81)
RDW: 15 % (ref 11.5–15.5)
WBC: 5.1 10*3/uL (ref 4.0–10.5)

## 2014-06-06 NOTE — Telephone Encounter (Signed)
Message copied by Betti Cruz on Tue Jun 06, 2014  4:00 PM ------      Message from: Boykin Nearing      Created: Tue Jun 06, 2014  9:37 AM       Stable hemoglobin ------

## 2014-06-06 NOTE — Telephone Encounter (Signed)
Left message with stable lab, if any question return call

## 2014-06-19 ENCOUNTER — Ambulatory Visit: Payer: Self-pay | Attending: Family Medicine | Admitting: *Deleted

## 2014-06-19 VITALS — BP 126/74 | HR 81 | Temp 98.2°F

## 2014-06-19 DIAGNOSIS — I10 Essential (primary) hypertension: Secondary | ICD-10-CM | POA: Insufficient documentation

## 2014-06-19 NOTE — Progress Notes (Signed)
Patient presents with wife for BP check States taking HCTZ 25 mg daily as directed Denies LE edema  BP 126/74 P 81 SPO2 94% T 98.2 oral  Patient provided information on DASH Eating Plan Patient scheduled for f/u with PCP in 2 weeks Patient scheduled to meet with financial counselor for [place] discount for GI referral

## 2014-06-19 NOTE — Patient Instructions (Signed)
DASH Eating Plan °DASH stands for "Dietary Approaches to Stop Hypertension." The DASH eating plan is a healthy eating plan that has been shown to reduce high blood pressure (hypertension). Additional health benefits may include reducing the risk of type 2 diabetes mellitus, heart disease, and stroke. The DASH eating plan may also help with weight loss. °WHAT DO I NEED TO KNOW ABOUT THE DASH EATING PLAN? °For the DASH eating plan, you will follow these general guidelines: °· Choose foods with a percent daily value for sodium of less than 5% (as listed on the food label). °· Use salt-free seasonings or herbs instead of table salt or sea salt. °· Check with your health care provider or pharmacist before using salt substitutes. °· Eat lower-sodium products, often labeled as "lower sodium" or "no salt added." °· Eat fresh foods. °· Eat more vegetables, fruits, and low-fat dairy products. °· Choose whole grains. Look for the word "whole" as the first word in the ingredient list. °· Choose fish and skinless chicken or turkey more often than red meat. Limit fish, poultry, and meat to 6 oz (170 g) each day. °· Limit sweets, desserts, sugars, and sugary drinks. °· Choose heart-healthy fats. °· Limit cheese to 1 oz (28 g) per day. °· Eat more home-cooked food and less restaurant, buffet, and fast food. °· Limit fried foods. °· Cook foods using methods other than frying. °· Limit canned vegetables. If you do use them, rinse them well to decrease the sodium. °· When eating at a restaurant, ask that your food be prepared with less salt, or no salt if possible. °WHAT FOODS CAN I EAT? °Seek help from a dietitian for individual calorie needs. °Grains °Whole grain or whole wheat bread. Brown rice. Whole grain or whole wheat pasta. Quinoa, bulgur, and whole grain cereals. Low-sodium cereals. Corn or whole wheat flour tortillas. Whole grain cornbread. Whole grain crackers. Low-sodium crackers. °Vegetables °Fresh or frozen vegetables  (raw, steamed, roasted, or grilled). Low-sodium or reduced-sodium tomato and vegetable juices. Low-sodium or reduced-sodium tomato sauce and paste. Low-sodium or reduced-sodium canned vegetables.  °Fruits °All fresh, canned (in natural juice), or frozen fruits. °Meat and Other Protein Products °Ground beef (85% or leaner), grass-fed beef, or beef trimmed of fat. Skinless chicken or turkey. Ground chicken or turkey. Pork trimmed of fat. All fish and seafood. Eggs. Dried beans, peas, or lentils. Unsalted nuts and seeds. Unsalted canned beans. °Dairy °Low-fat dairy products, such as skim or 1% milk, 2% or reduced-fat cheeses, low-fat ricotta or cottage cheese, or plain low-fat yogurt. Low-sodium or reduced-sodium cheeses. °Fats and Oils °Tub margarines without trans fats. Light or reduced-fat mayonnaise and salad dressings (reduced sodium). Avocado. Safflower, olive, or canola oils. Natural peanut or almond butter. °Other °Unsalted popcorn and pretzels. °The items listed above may not be a complete list of recommended foods or beverages. Contact your dietitian for more options. °WHAT FOODS ARE NOT RECOMMENDED? °Grains °White bread. White pasta. White rice. Refined cornbread. Bagels and croissants. Crackers that contain trans fat. °Vegetables °Creamed or fried vegetables. Vegetables in a cheese sauce. Regular canned vegetables. Regular canned tomato sauce and paste. Regular tomato and vegetable juices. °Fruits °Dried fruits. Canned fruit in light or heavy syrup. Fruit juice. °Meat and Other Protein Products °Fatty cuts of meat. Ribs, chicken wings, bacon, sausage, bologna, salami, chitterlings, fatback, hot dogs, bratwurst, and packaged luncheon meats. Salted nuts and seeds. Canned beans with salt. °Dairy °Whole or 2% milk, cream, half-and-half, and cream cheese. Whole-fat or sweetened yogurt. Full-fat   cheeses or blue cheese. Nondairy creamers and whipped toppings. Processed cheese, cheese spreads, or cheese  curds. °Condiments °Onion and garlic salt, seasoned salt, table salt, and sea salt. Canned and packaged gravies. Worcestershire sauce. Tartar sauce. Barbecue sauce. Teriyaki sauce. Soy sauce, including reduced sodium. Steak sauce. Fish sauce. Oyster sauce. Cocktail sauce. Horseradish. Ketchup and mustard. Meat flavorings and tenderizers. Bouillon cubes. Hot sauce. Tabasco sauce. Marinades. Taco seasonings. Relishes. °Fats and Oils °Butter, stick margarine, lard, shortening, ghee, and bacon fat. Coconut, palm kernel, or palm oils. Regular salad dressings. °Other °Pickles and olives. Salted popcorn and pretzels. °The items listed above may not be a complete list of foods and beverages to avoid. Contact your dietitian for more information. °WHERE CAN I FIND MORE INFORMATION? °National Heart, [employer], and [employer] Institute: www.nhlbi.nih.gov/health/health-topics/topics/dash/ °Document Released: 07/17/2011 Document Revised: 12/12/2013 Document Reviewed: 06/01/2013 °ExitCare® Patient Information ©2015 [employer]. This information is not intended to replace advice given to you by your health care provider. Make sure you discuss any questions you have with your health care provider. ° °

## 2014-07-03 ENCOUNTER — Encounter: Payer: Self-pay | Admitting: Family Medicine

## 2014-07-11 ENCOUNTER — Ambulatory Visit: Payer: Self-pay | Attending: Family Medicine | Admitting: *Deleted

## 2014-07-11 VITALS — BP 134/78 | HR 75 | Temp 98.4°F | Resp 18

## 2014-07-11 DIAGNOSIS — I10 Essential (primary) hypertension: Secondary | ICD-10-CM | POA: Insufficient documentation

## 2014-07-11 NOTE — Progress Notes (Signed)
Patient presents for BP check States taking all meds as directed States with the exception of the holiday meal he has been better about adhering to low sodium diet  BP 134/78 P 75 R 18 T 98.4 oral SPO2 95%  Patient advised to call for med refills at least 7 days before running out so as not to go without. Patient aware that he is over due for f/u with PCP and advised to schedule appt.

## 2014-07-17 ENCOUNTER — Encounter: Payer: Self-pay | Admitting: Family Medicine

## 2014-07-17 ENCOUNTER — Ambulatory Visit: Payer: Self-pay | Attending: Family Medicine | Admitting: Family Medicine

## 2014-07-17 VITALS — BP 130/81 | HR 77 | Temp 98.3°F | Resp 18 | Ht 73.0 in | Wt 290.0 lb

## 2014-07-17 DIAGNOSIS — I10 Essential (primary) hypertension: Secondary | ICD-10-CM | POA: Insufficient documentation

## 2014-07-17 DIAGNOSIS — Z87891 Personal history of nicotine dependence: Secondary | ICD-10-CM | POA: Insufficient documentation

## 2014-07-17 DIAGNOSIS — K625 Hemorrhage of anus and rectum: Secondary | ICD-10-CM | POA: Insufficient documentation

## 2014-07-17 DIAGNOSIS — Z6838 Body mass index (BMI) 38.0-38.9, adult: Secondary | ICD-10-CM | POA: Insufficient documentation

## 2014-07-17 MED ORDER — HYDROCHLOROTHIAZIDE 25 MG PO TABS: 25.0000 mg | ORAL_TABLET | Freq: Every day | ORAL | Status: DC

## 2014-07-17 NOTE — Assessment & Plan Note (Addendum)
A: no recurrent bleeding. Would like GI referral for suspect internal hemorrhoids P:  GI referral placed

## 2014-07-17 NOTE — Assessment & Plan Note (Signed)
A: BP well controlled P: Continue HCTZ 25 mg daily  F/u in 3 months

## 2014-07-17 NOTE — Progress Notes (Signed)
   Subjective:    Patient ID: Colin Cole, male    DOB: August 19, 1970, 43 y.o.   MRN: [id number] CC: HTN f/u HPI 1. CHRONIC HYPERTENSION  Disease Monitoring  Blood pressure range: not checking   Chest pain: no   Dyspnea: no   Claudication: no   Medication compliance: yes  Medication Side Effects  Lightheadedness: no   Urinary frequency: no   Edema: no   Impotence: no   Preventitive Healthcare:  Exercise: yes   Diet Pattern: 3 meals per day   Salt Restriction: yes   2. Rectal bleeding: no recurrent bleeding.   Soc hx: former smoker. Quit  Review of Systems As per HPI     Objective:   Physical Exam BP 130/81 mmHg  Pulse 77  Temp(Src) 98.3 F (36.8 C) (Oral)  Resp 18  Ht 6\' 1"  (1.854 m)  Wt 290 lb (131.543 kg)  BMI 38.27 kg/m2  SpO2 97%  BP Readings from Last 3 Encounters:  07/17/14 130/81  07/11/14 134/78  06/19/14 126/74  General appearance: alert, cooperative and no distress Lungs: clear to auscultation bilaterally Heart: regular rate and rhythm, S1, S2 normal, no murmur, click, rub or gallop Extremities: extremities normal, atraumatic, no cyanosis or edema       Assessment & Plan:

## 2014-07-18 ENCOUNTER — Encounter: Payer: Self-pay | Admitting: Internal Medicine

## 2014-08-02 ENCOUNTER — Encounter (HOSPITAL_BASED_OUTPATIENT_CLINIC_OR_DEPARTMENT_OTHER): Payer: Self-pay

## 2014-08-02 ENCOUNTER — Emergency Department (HOSPITAL_BASED_OUTPATIENT_CLINIC_OR_DEPARTMENT_OTHER)
Admission: EM | Admit: 2014-08-02 | Discharge: 2014-08-02 | Disposition: A | Discharge status: Home / Self Care | Payer: No Typology Code available for payment source | Attending: Emergency Medicine | Admitting: Emergency Medicine

## 2014-08-02 DIAGNOSIS — I10 Essential (primary) hypertension: Secondary | ICD-10-CM | POA: Insufficient documentation

## 2014-08-02 DIAGNOSIS — Z79899 Other long term (current) drug therapy: Secondary | ICD-10-CM | POA: Insufficient documentation

## 2014-08-02 DIAGNOSIS — L02511 Cutaneous abscess of right hand: Secondary | ICD-10-CM

## 2014-08-02 DIAGNOSIS — Z87891 Personal history of nicotine dependence: Secondary | ICD-10-CM | POA: Insufficient documentation

## 2014-08-02 MED ORDER — SULFAMETHOXAZOLE-TRIMETHOPRIM 800-160 MG PO TABS: 1.0000 | ORAL_TABLET | Freq: Two times a day (BID) | ORAL | Status: AC

## 2014-08-02 MED ORDER — LIDOCAINE HCL (PF) 1 % IJ SOLN
INTRAMUSCULAR | Status: AC
  Filled 2014-08-02: qty 5

## 2014-08-02 MED ORDER — HYDROCODONE-ACETAMINOPHEN 5-325 MG PO TABS
2.0000 | ORAL_TABLET | Freq: Once | ORAL | Status: AC
Start: 2014-08-02 — End: 2014-08-02
  Administered 2014-08-02: 2 via ORAL
  Filled 2014-08-02: qty 2

## 2014-08-02 MED ORDER — HYDROCODONE-ACETAMINOPHEN 5-325 MG PO TABS
1.0000 | ORAL_TABLET | ORAL | Status: DC | PRN
Start: 2014-08-02 — End: 2014-10-10

## 2014-08-02 NOTE — ED Notes (Signed)
Right index finger swelling-? Insect bite approx 1 week

## 2014-08-02 NOTE — ED Notes (Signed)
abscess to 1st finger rt hand  Increased swelling and pain

## 2014-08-02 NOTE — ED Provider Notes (Signed)
CSN: [id number]     Arrival date & time 08/02/14  1828 History   First MD Initiated Contact with Patient 08/02/14 1902     Chief Complaint  Patient presents with  . Hand Pain     (Consider location/radiation/quality/duration/timing/severity/associated sxs/prior Treatment) HPI Comments: 43 year old male presenting with swelling to his right index finger 1 week. Patient reports there has been a small amount of drainage. He believes he was bit by a spider. States it is painful to touch. Denies any fevers. No injury or trauma.  Patient is a 43 y.o. male presenting with hand pain. The history is provided by the patient.  Hand Pain Pertinent negatives include no fever, numbness or vomiting.    Past Medical History  Diagnosis Date  . Hypertension Dx 2010   Past Surgical History  Procedure Laterality Date  . Hernia repair    . Hydrocele excision     Family History  Problem Relation Age of Onset  . Diabetes Mother   . Hypertension Mother   . Diabetes Father   . Hypertension Father    History  Substance Use Topics  . Smoking status: Former Research scientist (life sciences)  . Smokeless tobacco: Never Used     Comment: Quit in 2010  . Alcohol Use: Yes    Review of Systems  Constitutional: Negative for fever.  HENT: Negative.   Respiratory: Negative.   Cardiovascular: Negative.   Gastrointestinal: Negative for vomiting.  Musculoskeletal:       + R finger pain and swelling.  Skin: Positive for wound.  Neurological: Negative for numbness.      Allergies  Asa and Terbinafine and related  Home Medications   Prior to Admission medications   Medication Sig Start Date End Date Taking? Authorizing Provider  docusate sodium (COLACE) 100 MG capsule Take 1 capsule (100 mg total) by mouth every 12 (twelve) hours. 05/27/14   Ezequiel Essex, MD  hydrochlorothiazide (HYDRODIURIL) 25 MG tablet Take 1 tablet (25 mg total) by mouth daily. 07/17/14   Minerva Ends, MD  HYDROcodone-acetaminophen  (NORCO/VICODIN) 5-325 MG per tablet Take 1 tablet by mouth every 4 (four) hours as needed. 08/02/14   Richele Strand M Reniyah Gootee, PA-C  sulfamethoxazole-trimethoprim (BACTRIM DS,SEPTRA DS) 800-160 MG per tablet Take 1 tablet by mouth 2 (two) times daily. 08/02/14 08/09/14  Rozell Kettlewell M Rishab Stoudt, PA-C   BP 131/87 mmHg  Pulse 101  Temp(Src) 99.1 F (37.3 C) (Oral)  Resp 18  Ht 6\' 1"  (1.854 m)  Wt 290 lb (131.543 kg)  BMI 38.27 kg/m2  SpO2 98% Physical Exam  Constitutional: He is oriented to person, place, and time. He appears well-developed and well-nourished. No distress.  HENT:  Head: Normocephalic and atraumatic.  Eyes: Conjunctivae and EOM are normal.  Neck: Normal range of motion. Neck supple.  Cardiovascular: Normal rate, regular rhythm and normal heart sounds.   Pulmonary/Chest: Effort normal and breath sounds normal.  Musculoskeletal:  1 cm area of fluctuance with central pustule on dorsum of right index finger between MCP and PIP. No surrounding erythema. Warm. FROM MCP and PIP without pain.  Neurological: He is alert and oriented to person, place, and time.  Skin: Skin is warm and dry.  Psychiatric: He has a normal mood and affect. His behavior is normal.  Nursing note and vitals reviewed.   ED Course  Procedures (including critical care time) INCISION AND DRAINAGE Performed by: Lucien Mons Consent: Verbal consent obtained. Risks and benefits: risks, benefits and alternatives were discussed Type: abscess  Body area:  right index finger  Anesthesia: local infiltration  Incision was made with a scalpel.  Local anesthetic: lidocaine 1% without epinephrine  Anesthetic total: 1 ml  Complexity: complex Blunt dissection to break up loculations  Drainage: purulent  Drainage amount: large  Packing material: none  Patient tolerance: Patient tolerated the procedure well with no immediate complications.    Labs Review Labs Reviewed - No data to display  Imaging Review No results  found.   EKG Interpretation None      MDM   Final diagnoses:  Abscess of finger, right   Patient is no apparent distress. Afebrile, vital signs stable. No tachycardia on my exam. Patient has an abscess to his right index finger. Abscess drained. Erythema does not cross joint line. Full range of motion of his finger. Discharge home with Bactrim and pain medicine. Advised warm soaks. Follow-up with PCP. Stable for discharge. Return precautions given. Patient states understanding of treatment care plan and is agreeable.   Carman Ching, PA-C 08/02/14 [place], MD 08/10/14 [phone]

## 2014-08-02 NOTE — Discharge Instructions (Signed)
Take antibiotic to completion as prescribed. Take Vicodin for severe pain only. No driving or operating heavy machinery while taking vicodin. This medication may cause drowsiness. Apply warm compresses and soak in warm water.  Abscess An abscess is an infected area that contains a collection of pus and debris.It can occur in almost any part of the body. An abscess is also known as a furuncle or boil. CAUSES  An abscess occurs when tissue gets infected. This can occur from blockage of oil or sweat glands, infection of hair follicles, or a minor injury to the skin. As the body tries to fight the infection, pus collects in the area and creates pressure under the skin. This pressure causes pain. People with weakened immune systems have difficulty fighting infections and get certain abscesses more often.  SYMPTOMS Usually an abscess develops on the skin and becomes a painful mass that is red, warm, and tender. If the abscess forms under the skin, you may feel a moveable soft area under the skin. Some abscesses break open (rupture) on their own, but most will continue to get worse without care. The infection can spread deeper into the body and eventually into the bloodstream, causing you to feel ill.  DIAGNOSIS  Your caregiver will take your medical history and perform a physical exam. A sample of fluid may also be taken from the abscess to determine what is causing your infection. TREATMENT  Your caregiver may prescribe antibiotic medicines to fight the infection. However, taking antibiotics alone usually does not cure an abscess. Your caregiver may need to make a small cut (incision) in the abscess to drain the pus. In some cases, gauze is packed into the abscess to reduce pain and to continue draining the area. HOME CARE INSTRUCTIONS   Only take over-the-counter or prescription medicines for pain, discomfort, or fever as directed by your caregiver.  If you were prescribed antibiotics, take them as  directed. Finish them even if you start to feel better.  If gauze is used, follow your caregiver's directions for changing the gauze.  To avoid spreading the infection:  Keep your draining abscess covered with a bandage.  Wash your hands well.  Do not share personal care items, towels, or whirlpools with others.  Avoid skin contact with others.  Keep your skin and clothes clean around the abscess.  Keep all follow-up appointments as directed by your caregiver. SEEK MEDICAL CARE IF:   You have increased pain, swelling, redness, fluid drainage, or bleeding.  You have muscle aches, chills, or a general ill feeling.  You have a fever. MAKE SURE YOU:   Understand these instructions.  Will watch your condition.  Will get help right away if you are not doing well or get worse. Document Released: 05/07/2005 Document Revised: 01/27/2012 Document Reviewed: 10/10/2011 [facility] Patient Information 2015 [place], [place]. This information is not intended to replace advice given to you by your health care provider. Make sure you discuss any questions you have with your health care provider.

## 2014-08-11 HISTORY — PX: HEMORRHOID BANDING: SHX5850

## 2014-08-22 ENCOUNTER — Other Ambulatory Visit: Payer: Self-pay

## 2014-08-22 ENCOUNTER — Ambulatory Visit (INDEPENDENT_AMBULATORY_CARE_PROVIDER_SITE_OTHER): Payer: Self-pay | Admitting: Nurse Practitioner

## 2014-08-22 VITALS — BP 128/81 | HR 72 | Temp 97.0°F | Ht 73.0 in | Wt 292.6 lb

## 2014-08-22 DIAGNOSIS — K625 Hemorrhage of anus and rectum: Secondary | ICD-10-CM

## 2014-08-22 DIAGNOSIS — K649 Unspecified hemorrhoids: Secondary | ICD-10-CM

## 2014-08-22 DIAGNOSIS — K5900 Constipation, unspecified: Secondary | ICD-10-CM | POA: Insufficient documentation

## 2014-08-22 MED ORDER — HYDROCORTISONE 2.5 % RE CREA: 1.0000 "application " | TOPICAL_CREAM | Freq: Two times a day (BID) | RECTAL | Status: DC

## 2014-08-22 MED ORDER — PEG-KCL-NACL-NASULF-NA ASC-C 100 G PO SOLR: 1.0000 | ORAL | Status: DC

## 2014-08-22 NOTE — Progress Notes (Signed)
Primary Care Physician:  Minerva Ends, MD Primary Gastroenterologist:  Dr. Gala Romney  Chief Complaint  Patient presents with  . Rectal Bleeding    HPI:   44 year old male presents for rectal bleeding. Rectal bleeding started early October 2015 and lasted 3 weeks. Was seen in the [facility] community clinic and FOBT negative. Was recommended a stool softener, preparation H, and sitz baths. Symptoms resolved within a couple days. Follow-up visit with the clinic in December documented bleeding resolved. Still has an external hemorrhoid. It occasionally is painful. Has a bowel movement one to two times a week. Has to stain sometimes, not as much since starting the stool softeners. After a BM sometimes has a sensation of incomplete emptying. Is taking the Colace daily. Is also on HCTZ. Averages 32 ounces of water a day. Consumes a lot of fruits and vegetables. Used to drink a lot of carbonated beverages but has cut back substantially. Urine tends to be on the darker side. Denies dysphagia. Admits GERD symptoms occasionally, typically if he eats meat on an empty stomach. States they're well controlled.Denies any other GI symptoms.  Past Medical History  Diagnosis Date  . Hypertension Dx 2010    Past Surgical History  Procedure Laterality Date  . Hernia repair    . Hydrocele excision      Current Outpatient Prescriptions  Medication Sig Dispense Refill  . docusate sodium (COLACE) 100 MG capsule Take 1 capsule (100 mg total) by mouth every 12 (twelve) hours. 60 capsule 0  . hydrochlorothiazide (HYDRODIURIL) 25 MG tablet Take 1 tablet (25 mg total) by mouth daily. 90 tablet 1  . HYDROcodone-acetaminophen (NORCO/VICODIN) 5-325 MG per tablet Take 1 tablet by mouth every 4 (four) hours as needed. 8 tablet 0   No current facility-administered medications for this visit.    Allergies as of 08/22/2014 - Review Complete 08/22/2014  Allergen Reaction Noted  . Asa [aspirin]  08/25/2012  .  Terbinafine and related Rash 10/30/2013    Family History  Problem Relation Age of Onset  . Diabetes Mother   . Hypertension Mother   . Diabetes Father   . Hypertension Father     History   Social History  . Marital Status: Divorced    Spouse Name: N/A    Number of Children: N/A  . Years of Education: N/A   Occupational History  . Not on file.   Social History Main Topics  . Smoking status: Former Research scientist (life sciences)  . Smokeless tobacco: Never Used     Comment: Quit in 2010  . Alcohol Use: Yes  . Drug Use: No  . Sexual Activity: Not on file   Other Topics Concern  . Not on file   Social History Narrative    Review of Systems: Gen: Denies any fever, chills, fatigue, weight loss, lack of appetite.  CV: Denies chest pain, heart palpitations, peripheral edema, syncope.  Resp: Denies shortness of breath at rest or with exertion. Denies wheezing or cough. No problems laying flat. GI: See HPI. Denies dysphagia or odynophagia. Denies jaundice, hematemesis, fecal incontinence. MS: Denies joint pain, muscle weakness, cramps, or limitation of movement.  Derm: Denies rash, itching, dry skin Psych: Denies depression, anxiety, memory loss, and confusion Heme: Denies bruising, bleeding, and enlarged lymph nodes.  Physical Exam: BP 128/81 mmHg  Pulse 72  Temp(Src) 97 F (36.1 C) (Oral)  Ht 6\' 1"  (1.854 m)  Wt 292 lb 9.6 oz (132.722 kg)  BMI 38.61 kg/m2 General:  Alert and oriented. Pleasant and cooperative. Well-nourished and well-developed.  Head:  Normocephalic and atraumatic. Eyes:  Without icterus, sclera clear and conjunctiva pink.  Ears:  Normal auditory acuity. Nose:  No deformity, discharge,  or lesions. Mouth:  No deformity or lesions, oral mucosa pink.  Neck:  Supple, without mass or thyromegaly. Lungs:  Clear to auscultation bilaterally. No wheezes, rales, or rhonchi. No distress.  Heart:  S1, S2 present without murmurs appreciated.  Abdomen:  +BS, soft, non-tender and  non-distended. No HSM noted. No guarding or rebound. No masses appreciated.  Rectal:  Deferred  Msk:  Symmetrical without gross deformities. Normal posture. Pulses:  Normal pulses noted. Extremities:  Without clubbing or edema. Neurologic:  Alert and  oriented x4;  grossly normal neurologically. Skin:  Intact without significant lesions or rashes. Cervical Nodes:  No significant cervical adenopathy. Psych:  Alert and cooperative. Normal mood and affect.     08/22/2014 11:00 AM

## 2014-08-22 NOTE — Patient Instructions (Signed)
1. We will schedule your colonoscopy 2. Start taking Miralax, one capful, every other day to help with your straining and constipation. 3. Will call in Anusol to your pharmacy that you can use for when your hemorrhoids bother you. 4. Further recommendations pending the results of your procedure.

## 2014-08-22 NOTE — Progress Notes (Signed)
cc'ed to pcp °

## 2014-08-23 ENCOUNTER — Telehealth: Payer: Self-pay | Admitting: Internal Medicine

## 2014-08-23 ENCOUNTER — Other Ambulatory Visit: Payer: Self-pay

## 2014-08-23 ENCOUNTER — Telehealth: Payer: Self-pay

## 2014-08-23 MED ORDER — PEG 3350-KCL-NA BICARB-NACL 420 G PO SOLR: 4000.0000 mL | Freq: Once | ORAL | Status: DC

## 2014-08-23 NOTE — Telephone Encounter (Signed)
Patient called for GF earlier. I told him that she was not available at the moment and I could transfer his call to her VM, but he said he would just call back. I believe it's in regards to medication. He called from this number [phone]

## 2014-08-23 NOTE — Telephone Encounter (Signed)
Spoke with pt and informed him of new prep instructions. They have been mailed.

## 2014-08-24 NOTE — Telephone Encounter (Signed)
PER CJ:  Spoke with pt and informed him of new prep instructions. They have been mailed

## 2014-08-25 ENCOUNTER — Telehealth: Payer: Self-pay

## 2014-08-25 NOTE — Telephone Encounter (Signed)
Pt called this morning wanting to know if he could use the PREP that his mother has at home. It sounds kine it might be the Try-lite but it is not in the gallon jug. I am not 100% sure what it is please advise.

## 2014-08-25 NOTE — Assessment & Plan Note (Signed)
Rectal bleeding recently, since resolved. Last FOBT negative. Bleeding likely due to known hemorrhoids, but cannot rule out more occult process.  Given this and patient's age being only 1 year from recommended start of screening colonoscopy for African Americans, will plan for a colonoscopy to evaluate further. In the meantime will recommend adding Miralax every other day to help with constipation and straining. If no improvement can consider additional therapeutic options. Have also called in annusol bid prn for no more than 7 days for symptomatic relief of painful hemorrhoids.  Proceed with TCS with Dr. Gala Romney in near future: the risks, benefits, and alternatives have been discussed with the patient in detail. The patient states understanding and desires to proceed.

## 2014-08-25 NOTE — Telephone Encounter (Signed)
Unfortunately without knowing what prep it is, if it's been opened (contaminated?), expired, etc we cannot recommend him using it.

## 2014-08-25 NOTE — Assessment & Plan Note (Addendum)
Rectal bleeding recently, since resolved. Last FOBT negative. Bleeding likely due to known hemorrhoids, but cannot rule out more occult process.  Given this and patient's age being only 1 year from recommended start of screening colonoscopy for African Americans, will plan for a colonoscopy to evaluate further. In the meantime will recommend adding Miralax every other day to help with constipation and straining. If no improvement can consider additional therapeutic options. Have also called in annusol bid prn for no more than 7 days for symptomatic relief of painful hemorrhoids.  Proceed with TCS with Dr. Gala Romney in near future: the risks, benefits, and alternatives have been discussed with the patient in detail. The patient states understanding and desires to proceed.

## 2014-08-28 NOTE — Telephone Encounter (Signed)
Pt is aware to go ahead and pick up the new Rx that we called in for him.

## 2014-09-05 ENCOUNTER — Telehealth: Payer: Self-pay | Admitting: Internal Medicine

## 2014-09-05 ENCOUNTER — Encounter (HOSPITAL_COMMUNITY)
Admission: RE | Disposition: A | Discharge status: Home / Self Care | Payer: Self-pay | Source: Ambulatory Visit | Attending: Internal Medicine

## 2014-09-05 ENCOUNTER — Encounter (HOSPITAL_COMMUNITY): Payer: Self-pay

## 2014-09-05 ENCOUNTER — Ambulatory Visit (HOSPITAL_COMMUNITY)
Admission: RE | Admit: 2014-09-05 | Discharge: 2014-09-05 | Disposition: A | Discharge status: Home / Self Care | Payer: Self-pay | Source: Ambulatory Visit | Attending: Internal Medicine | Admitting: Internal Medicine

## 2014-09-05 DIAGNOSIS — D127 Benign neoplasm of rectosigmoid junction: Secondary | ICD-10-CM | POA: Insufficient documentation

## 2014-09-05 DIAGNOSIS — I10 Essential (primary) hypertension: Secondary | ICD-10-CM | POA: Insufficient documentation

## 2014-09-05 DIAGNOSIS — K644 Residual hemorrhoidal skin tags: Secondary | ICD-10-CM | POA: Insufficient documentation

## 2014-09-05 DIAGNOSIS — Z8249 Family history of ischemic heart disease and other diseases of the circulatory system: Secondary | ICD-10-CM | POA: Insufficient documentation

## 2014-09-05 DIAGNOSIS — K625 Hemorrhage of anus and rectum: Secondary | ICD-10-CM

## 2014-09-05 DIAGNOSIS — Z860100 Personal history of colon polyps, unspecified: Secondary | ICD-10-CM | POA: Insufficient documentation

## 2014-09-05 DIAGNOSIS — K921 Melena: Secondary | ICD-10-CM

## 2014-09-05 DIAGNOSIS — Z87891 Personal history of nicotine dependence: Secondary | ICD-10-CM | POA: Insufficient documentation

## 2014-09-05 DIAGNOSIS — Z79899 Other long term (current) drug therapy: Secondary | ICD-10-CM | POA: Insufficient documentation

## 2014-09-05 DIAGNOSIS — K642 Third degree hemorrhoids: Secondary | ICD-10-CM | POA: Insufficient documentation

## 2014-09-05 DIAGNOSIS — K635 Polyp of colon: Secondary | ICD-10-CM

## 2014-09-05 HISTORY — PX: COLONOSCOPY: SHX5424

## 2014-09-05 SURGERY — COLONOSCOPY
Anesthesia: Moderate Sedation

## 2014-09-05 MED ORDER — ONDANSETRON HCL 4 MG/2ML IJ SOLN
INTRAMUSCULAR | Status: DC | PRN
  Administered 2014-09-05: 4 mg via INTRAVENOUS

## 2014-09-05 MED ORDER — MIDAZOLAM HCL 5 MG/5ML IJ SOLN
INTRAMUSCULAR | Status: DC | PRN
  Administered 2014-09-05 (×2): 1 mg via INTRAVENOUS
  Administered 2014-09-05 (×2): 2 mg via INTRAVENOUS

## 2014-09-05 MED ORDER — ONDANSETRON HCL 4 MG/2ML IJ SOLN
INTRAMUSCULAR | Status: AC
  Filled 2014-09-05: qty 2

## 2014-09-05 MED ORDER — MEPERIDINE HCL 100 MG/ML IJ SOLN
INTRAMUSCULAR | Status: DC
Start: 2014-09-05 — End: 2014-09-05
  Filled 2014-09-05: qty 2

## 2014-09-05 MED ORDER — MIDAZOLAM HCL 5 MG/5ML IJ SOLN
INTRAMUSCULAR | Status: DC
Start: 2014-09-05 — End: 2014-09-05
  Filled 2014-09-05: qty 10

## 2014-09-05 MED ORDER — SODIUM CHLORIDE 0.9 % IV SOLN
INTRAVENOUS | Status: DC
  Administered 2014-09-05: 11:00:00 via INTRAVENOUS

## 2014-09-05 MED ORDER — MEPERIDINE HCL 100 MG/ML IJ SOLN
INTRAMUSCULAR | Status: DC | PRN
  Administered 2014-09-05: 50 mg
  Administered 2014-09-05 (×2): 25 mg

## 2014-09-05 MED ORDER — STERILE WATER FOR IRRIGATION IR SOLN
Status: DC | PRN
  Administered 2014-09-05: 12:00:00

## 2014-09-05 NOTE — Telephone Encounter (Signed)
Pt had procedure today by [facility] and will have a banding on 3/1. Patient called to say that the Anusol [facility] supp is too expensive and he needs something else called into [facility]. Please advise. [phone]

## 2014-09-05 NOTE — H&P (View-Only) (Signed)
Primary Care Physician:  Minerva Ends, MD Primary Gastroenterologist:  Dr. Gala Romney  Chief Complaint  Patient presents with  . Rectal Bleeding    HPI:   44 year old male presents for rectal bleeding. Rectal bleeding started early October 2015 and lasted 3 weeks. Was seen in the [facility] community clinic and FOBT negative. Was recommended a stool softener, preparation H, and sitz baths. Symptoms resolved within a couple days. Follow-up visit with the clinic in December documented bleeding resolved. Still has an external hemorrhoid. It occasionally is painful. Has a bowel movement one to two times a week. Has to stain sometimes, not as much since starting the stool softeners. After a BM sometimes has a sensation of incomplete emptying. Is taking the Colace daily. Is also on HCTZ. Averages 32 ounces of water a day. Consumes a lot of fruits and vegetables. Used to drink a lot of carbonated beverages but has cut back substantially. Urine tends to be on the darker side. Denies dysphagia. Admits GERD symptoms occasionally, typically if he eats meat on an empty stomach. States they're well controlled.Denies any other GI symptoms.  Past Medical History  Diagnosis Date  . Hypertension Dx 2010    Past Surgical History  Procedure Laterality Date  . Hernia repair    . Hydrocele excision      Current Outpatient Prescriptions  Medication Sig Dispense Refill  . docusate sodium (COLACE) 100 MG capsule Take 1 capsule (100 mg total) by mouth every 12 (twelve) hours. 60 capsule 0  . hydrochlorothiazide (HYDRODIURIL) 25 MG tablet Take 1 tablet (25 mg total) by mouth daily. 90 tablet 1  . HYDROcodone-acetaminophen (NORCO/VICODIN) 5-325 MG per tablet Take 1 tablet by mouth every 4 (four) hours as needed. 8 tablet 0   No current facility-administered medications for this visit.    Allergies as of 08/22/2014 - Review Complete 08/22/2014  Allergen Reaction Noted  . Asa [aspirin]  08/25/2012  .  Terbinafine and related Rash 10/30/2013    Family History  Problem Relation Age of Onset  . Diabetes Mother   . Hypertension Mother   . Diabetes Father   . Hypertension Father     History   Social History  . Marital Status: Divorced    Spouse Name: N/A    Number of Children: N/A  . Years of Education: N/A   Occupational History  . Not on file.   Social History Main Topics  . Smoking status: Former Research scientist (life sciences)  . Smokeless tobacco: Never Used     Comment: Quit in 2010  . Alcohol Use: Yes  . Drug Use: No  . Sexual Activity: Not on file   Other Topics Concern  . Not on file   Social History Narrative    Review of Systems: Gen: Denies any fever, chills, fatigue, weight loss, lack of appetite.  CV: Denies chest pain, heart palpitations, peripheral edema, syncope.  Resp: Denies shortness of breath at rest or with exertion. Denies wheezing or cough. No problems laying flat. GI: See HPI. Denies dysphagia or odynophagia. Denies jaundice, hematemesis, fecal incontinence. MS: Denies joint pain, muscle weakness, cramps, or limitation of movement.  Derm: Denies rash, itching, dry skin Psych: Denies depression, anxiety, memory loss, and confusion Heme: Denies bruising, bleeding, and enlarged lymph nodes.  Physical Exam: BP 128/81 mmHg  Pulse 72  Temp(Src) 97 F (36.1 C) (Oral)  Ht 6\' 1"  (1.854 m)  Wt 292 lb 9.6 oz (132.722 kg)  BMI 38.61 kg/m2 General:  Alert and oriented. Pleasant and cooperative. Well-nourished and well-developed.  Head:  Normocephalic and atraumatic. Eyes:  Without icterus, sclera clear and conjunctiva pink.  Ears:  Normal auditory acuity. Nose:  No deformity, discharge,  or lesions. Mouth:  No deformity or lesions, oral mucosa pink.  Neck:  Supple, without mass or thyromegaly. Lungs:  Clear to auscultation bilaterally. No wheezes, rales, or rhonchi. No distress.  Heart:  S1, S2 present without murmurs appreciated.  Abdomen:  +BS, soft, non-tender and  non-distended. No HSM noted. No guarding or rebound. No masses appreciated.  Rectal:  Deferred  Msk:  Symmetrical without gross deformities. Normal posture. Pulses:  Normal pulses noted. Extremities:  Without clubbing or edema. Neurologic:  Alert and  oriented x4;  grossly normal neurologically. Skin:  Intact without significant lesions or rashes. Cervical Nodes:  No significant cervical adenopathy. Psych:  Alert and cooperative. Normal mood and affect.     08/22/2014 11:00 AM

## 2014-09-05 NOTE — Op Note (Signed)
[facility] [address] [place], [address]   COLONOSCOPY PROCEDURE REPORT  PATIENT: Colin Cole, Colin Cole  MR#: [id number] BIRTHDATE: [birth year] , 43  yrs. old GENDER: male ENDOSCOPIST: R.  Garfield Cornea, MD FACP FACG REFERRED NK:[id number] Funches, MD PROCEDURE DATE:  09-07-2014 PROCEDURE:   Colonoscopy with ablation and Colonoscopy with snare polypectomy INDICATIONS:paper hematochezia; first ever colonoscopy. MEDICATIONS: Versed 6 mg IV and Demerol 100 mg IV in divided doses. Zofran 4 mg IV. ASA CLASS:       Class II  CONSENT: The risks, benefits, alternatives and imponderables including but not limited to bleeding, perforation as well as the possibility of a missed lesion have been reviewed.  The potential for biopsy, lesion removal, etc. have also been discussed. Questions have been answered.  All parties agreeable.  Please see the history and physical in the medical record for more information.  DESCRIPTION OF PROCEDURE:   After the risks benefits and alternatives of the procedure were thoroughly explained, informed consent was obtained.  The digital rectal exam revealed no abnormalities of the rectum.   The EC-[id number] ([id number])  endoscope was introduced through the anus and advanced to the cecum, which was identified by both the appendix and ileocecal valve. No adverse events experienced.   The quality of the prep was adequate.  The instrument was then slowly withdrawn as the colon was fully examined.      COLON FINDINGS: One small grade 3 hemorrhoid tag; friable anal canal hemorrhoids.  Multiple diminutive hyperplastic appearing polyps at the rectosigmoid junction.  The patient had multiple 6 mm hyperplastic appearing polyps at this level as well; The remainder of the colonic mucosa.  Normal.  The rectal vault was small.  I was unable to retroflex.  However, I feel the rectal mucosa was seen well on [place].  I did not see any evidence of anal carcinoma or  other neoplasm.  Retroflexion was not performed.  Larger rectosigmoid polyps removed with hot snare cautery. Multiple diminutive polyps ablated with the tip of a hot snare loop..  Withdrawal time=21 minutes 0 seconds.  The scope was withdrawn and the procedure completed. COMPLICATIONS: There were no immediate complications.  ENDOSCOPIC IMPRESSION: Grade 3 hemorrhoid. Friable anal canal hemorrhoids. Multiple hyperplastic appearing polyps at the rectosigmoid junction; remainder colonoscopy. Normal. Multiple ablations and snare polypectomies performed.  I suspect anorectal bleeding from hemorrhoids.  RECOMMENDATIONS: Follow-up on pathology. Add Benefiber; continue Colace; ten-day course of twice a day Anusol suppositories. Office visit with [place] in 4 weeks. May be a good hemorrhoid banding candidate.  eSigned:  R. Garfield Cornea, MD Rosalita Chessman [facility] 09/07/2014 12:53 PM   cc:  CPT CODES: ICD CODES:  The ICD and CPT codes recommended by this software are interpretations from the data that the clinical staff has captured with the software.  The verification of the translation of this report to the ICD and CPT codes and modifiers is the sole responsibility of the health care institution and practicing physician where this report was generated.  [employer] will not be held responsible for the validity of the ICD and CPT codes included on this report.  AMA assumes no liability for data contained or not contained herein. CPT is a Designer, television/film set of the [employer].  PATIENT NAME:  Colin Cole, Colin Cole MR#: [id number]

## 2014-09-05 NOTE — Discharge Instructions (Signed)
Colonoscopy Discharge Instructions  Read the instructions outlined below and refer to this sheet in the next few weeks. These discharge instructions provide you with general information on caring for yourself after you leave the hospital. Your doctor may also give you specific instructions. While your treatment has been planned according to the most current medical practices available, unavoidable complications occasionally occur. If you have any problems or questions after discharge, call Dr. Gala Romney at [phone]. ACTIVITY  You may resume your regular activity, but move at a slower pace for the next 24 hours.   Take frequent rest periods for the next 24 hours.   Walking will help get rid of the air and reduce the bloated feeling in your belly (abdomen).   No driving for 24 hours (because of the medicine (anesthesia) used during the test).    Do not sign any important legal documents or operate any machinery for 24 hours (because of the anesthesia used during the test).  NUTRITION  Drink plenty of fluids.   You may resume your normal diet as instructed by your doctor.   Begin with a light meal and progress to your normal diet. Heavy or fried foods are harder to digest and may make you feel sick to your stomach (nauseated).   Avoid alcoholic beverages for 24 hours or as instructed.  MEDICATIONS  You may resume your normal medications unless your doctor tells you otherwise.  WHAT YOU CAN EXPECT TODAY  Some feelings of bloating in the abdomen.   Passage of more gas than usual.   Spotting of blood in your stool or on the toilet paper.  IF YOU HAD POLYPS REMOVED DURING THE COLONOSCOPY:  No aspirin products for 7 days or as instructed.   No alcohol for 7 days or as instructed.   Eat a soft diet for the next 24 hours.  FINDING OUT THE RESULTS OF YOUR TEST Not all test results are available during your visit. If your test results are not back during the visit, make an appointment  with your caregiver to find out the results. Do not assume everything is normal if you have not heard from your caregiver or the medical facility. It is important for you to follow up on all of your test results.  SEEK IMMEDIATE MEDICAL ATTENTION IF:  You have more than a spotting of blood in your stool.   Your belly is swollen (abdominal distention).   You are nauseated or vomiting.   You have a temperature over 101.   You have abdominal pain or discomfort that is severe or gets worse throughout the day.   Polyp and hemorrhoid information provided  Further recommendations to follow pending review of pathology report  Anusol suppositories 1 per rectum twice daily for 10 days  Colace 100 mg twice daily-every day  Benefiber 2 teaspoons twice daily  Office visit with [place] in one month. He may benefit from having his hemorrhoids banded.   Hemorrhoids Hemorrhoids are swollen veins around the rectum or anus. There are two types of hemorrhoids:   Internal hemorrhoids. These occur in the veins just inside the rectum. They may poke through to the outside and become irritated and painful.  External hemorrhoids. These occur in the veins outside the anus and can be felt as a painful swelling or hard lump near the anus. CAUSES  Pregnancy.   Obesity.   Constipation or diarrhea.   Straining to have a bowel movement.   Sitting for long periods on the toilet.  Heavy lifting or other activity that caused you to strain.  Anal intercourse. SYMPTOMS   Pain.   Anal itching or irritation.   Rectal bleeding.   Fecal leakage.   Anal swelling.   One or more lumps around the anus.  DIAGNOSIS  Your caregiver may be able to diagnose hemorrhoids by visual examination. Other examinations or tests that may be performed include:   Examination of the rectal area with a gloved hand (digital rectal exam).   Examination of anal canal using a small tube (scope).   A blood test  if you have lost a significant amount of blood.  A test to look inside the colon (sigmoidoscopy or colonoscopy). TREATMENT Most hemorrhoids can be treated at home. However, if symptoms do not seem to be getting better or if you have a lot of rectal bleeding, your caregiver may perform a procedure to help make the hemorrhoids get smaller or remove them completely. Possible treatments include:   Placing a rubber band at the base of the hemorrhoid to cut off the circulation (rubber band ligation).   Injecting a chemical to shrink the hemorrhoid (sclerotherapy).   Using a tool to burn the hemorrhoid (infrared light therapy).   Surgically removing the hemorrhoid (hemorrhoidectomy).   Stapling the hemorrhoid to block blood flow to the tissue (hemorrhoid stapling).  HOME CARE INSTRUCTIONS   Eat foods with fiber, such as whole grains, beans, nuts, fruits, and vegetables. Ask your doctor about taking products with added fiber in them (fibersupplements).  Increase fluid intake. Drink enough water and fluids to keep your urine clear or pale yellow.   Exercise regularly.   Go to the bathroom when you have the urge to have a bowel movement. Do not wait.   Avoid straining to have bowel movements.   Keep the anal area dry and clean. Use wet toilet paper or moist towelettes after a bowel movement.   Medicated creams and suppositories may be used or applied as directed.   Only take over-the-counter or prescription medicines as directed by your caregiver.   Take warm sitz baths for 15-20 minutes, 3-4 times a day to ease pain and discomfort.   Place ice packs on the hemorrhoids if they are tender and swollen. Using ice packs between sitz baths may be helpful.   Put ice in a plastic bag.   Place a towel between your skin and the bag.   Leave the ice on for 15-20 minutes, 3-4 times a day.   Do not use a donut-shaped pillow or sit on the toilet for long periods. This increases  blood pooling and pain.  SEEK MEDICAL CARE IF:  You have increasing pain and swelling that is not controlled by treatment or medicine.  You have uncontrolled bleeding.  You have difficulty or you are unable to have a bowel movement.  You have pain or inflammation outside the area of the hemorrhoids. MAKE SURE YOU:  Understand these instructions.  Will watch your condition.  Will get help right away if you are not doing well or get worse. Document Released: 07/25/2000 Document Revised: 07/14/2012 Document Reviewed: 06/01/2012 [facility] Patient Information 2015 [place], [place]. This information is not intended to replace advice given to you by your health care provider. Make sure you discuss any questions you have with your health care provider.    Colon Polyps Polyps are lumps of extra tissue growing inside the body. Polyps can grow in the large intestine (colon). Most colon polyps are noncancerous (benign). However,  some colon polyps can become cancerous over time. Polyps that are larger than a pea may be harmful. To be safe, caregivers remove and test all polyps. CAUSES  Polyps form when mutations in the genes cause your cells to grow and divide even though no more tissue is needed. RISK FACTORS There are a number of risk factors that can increase your chances of getting colon polyps. They include:  Being older than 50 years.  Family history of colon polyps or colon cancer.  Long-term colon diseases, such as colitis or Crohn disease.  Being overweight.  Smoking.  Being inactive.  Drinking too much alcohol. SYMPTOMS  Most small polyps do not cause symptoms. If symptoms are present, they may include:  Blood in the stool. The stool may look dark red or black.  Constipation or diarrhea that lasts longer than 1 week. DIAGNOSIS People often do not know they have polyps until their caregiver finds them during a regular checkup. Your caregiver can use 4 tests to check for  polyps:  Digital rectal exam. The caregiver wears gloves and feels inside the rectum. This test would find polyps only in the rectum.  Barium enema. The caregiver puts a liquid called barium into your rectum before taking X-rays of your colon. Barium makes your colon look white. Polyps are dark, so they are easy to see in the X-ray pictures.  Sigmoidoscopy. A thin, flexible tube (sigmoidoscope) is placed into your rectum. The sigmoidoscope has a light and tiny camera in it. The caregiver uses the sigmoidoscope to look at the last third of your colon.  Colonoscopy. This test is like sigmoidoscopy, but the caregiver looks at the entire colon. This is the most common method for finding and removing polyps. TREATMENT  Any polyps will be removed during a sigmoidoscopy or colonoscopy. The polyps are then tested for cancer. PREVENTION  To help lower your risk of getting more colon polyps:  Eat plenty of fruits and vegetables. Avoid eating fatty foods.  Do not smoke.  Avoid drinking alcohol.  Exercise every day.  Lose weight if recommended by your caregiver.  Eat plenty of calcium and folate. Foods that are rich in calcium include milk, cheese, and broccoli. Foods that are rich in folate include chickpeas, kidney beans, and spinach. HOME CARE INSTRUCTIONS Keep all follow-up appointments as directed by your caregiver. You may need periodic exams to check for polyps. SEEK MEDICAL CARE IF: You notice bleeding during a bowel movement. Document Released: 04/23/2004 Document Revised: 10/20/2011 Document Reviewed: 10/07/2011 [facility] Patient Information 2015 [place], [place]. This information is not intended to replace advice given to you by your health care provider. Make sure you discuss any questions you have with your health care provider.

## 2014-09-05 NOTE — Interval H&P Note (Signed)
History and Physical Interval Note:  09/05/2014 11:55 AM  Colin Cole  has presented today for surgery, with the diagnosis of rectal bleeding  The various methods of treatment have been discussed with the patient and family. After consideration of risks, benefits and other options for treatment, the patient has consented to  Procedure(s) with comments: COLONOSCOPY (N/A) - 1130-pt request time  as a surgical intervention .  The patient's history has been reviewed, patient examined, no change in status, stable for surgery.  I have reviewed the patient's chart and labs.  Questions were answered to the patient's satisfaction.     Robert Rourk  No change. Colonoscopy per plan.The risks, benefits, limitations, alternatives and imponderables have been reviewed with the patient. Questions have been answered. All parties are agreeable.

## 2014-09-06 ENCOUNTER — Encounter (HOSPITAL_COMMUNITY): Payer: Self-pay | Admitting: Internal Medicine

## 2014-09-06 ENCOUNTER — Encounter: Payer: Self-pay | Admitting: Internal Medicine

## 2014-09-06 MED ORDER — HYDROCORTISONE ACE-PRAMOXINE 2.5-1 % EX CREA: TOPICAL_CREAM | CUTANEOUS | Status: DC

## 2014-09-06 NOTE — Telephone Encounter (Signed)
Spoke with the pharmacist at [facility]. The only thing they have to treat hemorrhoids is hydrocortisone 2.5%/ pramoxine 1% cream. The patient does not have any insurance and this is the only thing he will be able to get. They will need an rx faxed to them at [phone]

## 2014-09-06 NOTE — Telephone Encounter (Signed)
What is one formulary at [facility]?

## 2014-09-06 NOTE — Telephone Encounter (Signed)
Okay; dispense 1 tube. Apply to the anorectum 3 times a day. One refill.

## 2014-09-06 NOTE — Telephone Encounter (Signed)
rx done and sent to the pharmacy.

## 2014-09-06 NOTE — Telephone Encounter (Signed)
We called [facility], they are not filling rx's for this pt. The pt uses [facility]. CM called and left them a message to call me back about rx. Pharmacy number is [phone]

## 2014-09-06 NOTE — Telephone Encounter (Signed)
Routing to RMR. 

## 2014-09-06 NOTE — Addendum Note (Signed)
Addended by: Claudina Lick on: 09/06/2014 02:07 PM   Modules accepted: Orders

## 2014-09-07 ENCOUNTER — Telehealth: Payer: Self-pay | Admitting: *Deleted

## 2014-09-07 NOTE — Telephone Encounter (Signed)
-----   Message from Minerva Ends, MD sent at 09/07/2014 11:08 AM EST ----- Hyperplastic polyp confirmed by pathology  Keep GI f/u and follow GI recommendations

## 2014-09-07 NOTE — Telephone Encounter (Signed)
Tried to call pt- LMOM with instructions.  

## 2014-09-07 NOTE — Telephone Encounter (Signed)
Pt aware.

## 2014-10-03 NOTE — Telephone Encounter (Signed)
error 

## 2014-10-10 ENCOUNTER — Encounter: Payer: Self-pay | Admitting: Internal Medicine

## 2014-10-10 ENCOUNTER — Ambulatory Visit (INDEPENDENT_AMBULATORY_CARE_PROVIDER_SITE_OTHER): Payer: Self-pay | Admitting: Internal Medicine

## 2014-10-10 VITALS — BP 131/74 | HR 73 | Temp 97.5°F | Ht 73.0 in | Wt 294.8 lb

## 2014-10-10 DIAGNOSIS — K642 Third degree hemorrhoids: Secondary | ICD-10-CM

## 2014-10-10 DIAGNOSIS — K648 Other hemorrhoids: Secondary | ICD-10-CM

## 2014-10-10 NOTE — Patient Instructions (Signed)
Avoid straining.  Benefiber 2 teaspoons twice daily  Limit toilet time to 2-5 minutes  Call with any interim problems  Schedule followup appointment in 4-5 weeks from now

## 2014-10-10 NOTE — Progress Notes (Signed)
Victor banding procedure note:  The patient presents with symptomatic grade 3 hemorrhoids, unresponsive to maximal medical therapy, requesting rubber band ligation of his/her hemorrhoidal disease. All risks, benefits, and alternative forms of therapy were described and informed consent was obtained.  Recent colonoscopy demonstrated hemorrhoids and hyperplastic polyps currently.  Patient spends too much time on the commode ( 20 minutes).  In the left lateral decubitus position, a digital rectal exam revealed no abnormalities..  The decision was made to band the left lateral internal hemorrhoid;  the [place] was used to perform band ligation without complication. Digital anorectal examination was then performed to assure proper positioning of the band and to adjust the banded tissue as required. Been found to be in good position without pinching or pain.The patient was discharged home without pain or other issues. Dietary and behavioral recommendations were given along with follow-up instructions. The patient will return in 4-5 weekd for followup and possible additional banding as required.  No complications were encountered and the patient tolerated the procedure well.

## 2014-10-24 ENCOUNTER — Ambulatory Visit: Payer: Self-pay | Attending: Family Medicine | Admitting: Family Medicine

## 2014-10-24 ENCOUNTER — Encounter: Payer: Self-pay | Admitting: Family Medicine

## 2014-10-24 VITALS — BP 134/87 | HR 69 | Temp 98.8°F | Resp 18 | Ht 73.0 in | Wt 290.0 lb

## 2014-10-24 DIAGNOSIS — Z23 Encounter for immunization: Secondary | ICD-10-CM

## 2014-10-24 DIAGNOSIS — I10 Essential (primary) hypertension: Secondary | ICD-10-CM | POA: Insufficient documentation

## 2014-10-24 DIAGNOSIS — Z114 Encounter for screening for human immunodeficiency virus [HIV]: Secondary | ICD-10-CM

## 2014-10-24 DIAGNOSIS — Z87891 Personal history of nicotine dependence: Secondary | ICD-10-CM | POA: Insufficient documentation

## 2014-10-24 MED ORDER — HYDROCHLOROTHIAZIDE 25 MG PO TABS: 25.0000 mg | ORAL_TABLET | Freq: Every day | ORAL | Status: DC

## 2014-10-24 NOTE — Assessment & Plan Note (Signed)
A: BP well controlled P: Continue HCTZ 25 mg daily  DASH diet  Add exercise  F/u in 6 months

## 2014-10-24 NOTE — Patient Instructions (Signed)
Colin Cole,  Thank you for coming in today.   1. HTN: BP at goal Continue HCTZ Add DASH diet and regular exercise.  F/u in 6 months  Dr. Adrian Blackwater  DASH Eating Plan DASH stands for "Dietary Approaches to Stop Hypertension." The DASH eating plan is a healthy eating plan that has been shown to reduce high blood pressure (hypertension). Additional health benefits may include reducing the risk of type 2 diabetes mellitus, heart disease, and stroke. The DASH eating plan may also help with weight loss. WHAT DO I NEED TO KNOW ABOUT THE DASH EATING PLAN? For the DASH eating plan, you will follow these general guidelines:  Choose foods with a percent daily value for sodium of less than 5% (as listed on the food label).  Use salt-free seasonings or herbs instead of table salt or sea salt.  Check with your health care provider or pharmacist before using salt substitutes.  Eat lower-sodium products, often labeled as "lower sodium" or "no salt added."  Eat fresh foods.  Eat more vegetables, fruits, and low-fat dairy products.  Choose whole grains. Look for the word "whole" as the first word in the ingredient list.  Choose fish and skinless chicken or [place] more often than red meat. Limit fish, poultry, and meat to 6 oz (170 g) each day.  Limit sweets, desserts, sugars, and sugary drinks.  Choose heart-healthy fats.  Limit cheese to 1 oz (28 g) per day.  Eat more home-cooked food and less restaurant, buffet, and fast food.  Limit fried foods.  Cook foods using methods other than frying.  Limit canned vegetables. If you do use them, rinse them well to decrease the sodium.  When eating at a restaurant, ask that your food be prepared with less salt, or no salt if possible. WHAT FOODS CAN I EAT? Seek help from a dietitian for individual calorie needs. Grains Whole grain or whole wheat bread. Brown rice. Whole grain or whole wheat pasta. Quinoa, bulgur, and whole grain cereals.  Low-sodium cereals. Corn or whole wheat flour tortillas. Whole grain cornbread. Whole grain crackers. Low-sodium crackers. Vegetables Fresh or frozen vegetables (raw, steamed, roasted, or grilled). Low-sodium or reduced-sodium tomato and vegetable juices. Low-sodium or reduced-sodium tomato sauce and paste. Low-sodium or reduced-sodium canned vegetables.  Fruits All fresh, canned (in natural juice), or frozen fruits. Meat and Other Protein Products Ground beef (85% or leaner), grass-fed beef, or beef trimmed of fat. Skinless chicken or [place]. Ground chicken or [place]. Pork trimmed of fat. All fish and seafood. Eggs. Dried beans, peas, or lentils. Unsalted nuts and seeds. Unsalted canned beans. Dairy Low-fat dairy products, such as skim or 1% milk, 2% or reduced-fat cheeses, low-fat ricotta or cottage cheese, or plain low-fat yogurt. Low-sodium or reduced-sodium cheeses. Fats and Oils Tub margarines without trans fats. Light or reduced-fat mayonnaise and salad dressings (reduced sodium). Avocado. Safflower, olive, or canola oils. Natural peanut or almond butter. Other Unsalted popcorn and pretzels. The items listed above may not be a complete list of recommended foods or beverages. Contact your dietitian for more options. WHAT FOODS ARE NOT RECOMMENDED? Grains White bread. White pasta. White rice. Refined cornbread. Bagels and croissants. Crackers that contain trans fat. Vegetables Creamed or fried vegetables. Vegetables in a cheese sauce. Regular canned vegetables. Regular canned tomato sauce and paste. Regular tomato and vegetable juices. Fruits Dried fruits. Canned fruit in light or heavy syrup. Fruit juice. Meat and Other Protein Products Fatty cuts of meat. Ribs, chicken wings, bacon, sausage, bologna,  salami, chitterlings, fatback, hot dogs, bratwurst, and packaged luncheon meats. Salted nuts and seeds. Canned beans with salt. Dairy Whole or 2% milk, cream, half-and-half, and cream  cheese. Whole-fat or sweetened yogurt. Full-fat cheeses or blue cheese. Nondairy creamers and whipped toppings. Processed cheese, cheese spreads, or cheese curds. Condiments Onion and garlic salt, seasoned salt, table salt, and sea salt. Canned and packaged gravies. Worcestershire sauce. Tartar sauce. Barbecue sauce. Teriyaki sauce. Soy sauce, including reduced sodium. Steak sauce. Fish sauce. Oyster sauce. Cocktail sauce. Horseradish. Ketchup and mustard. Meat flavorings and tenderizers. Bouillon cubes. Hot sauce. Tabasco sauce. Marinades. Taco seasonings. Relishes. Fats and Oils Butter, stick margarine, lard, shortening, ghee, and bacon fat. Coconut, palm kernel, or palm oils. Regular salad dressings. Other Pickles and olives. Salted popcorn and pretzels. The items listed above may not be a complete list of foods and beverages to avoid. Contact your dietitian for more information. WHERE CAN I FIND MORE INFORMATION? National Heart, Lung, and Blood Institute: travelstabloid.com Document Released: 07/17/2011 Document Revised: 12/12/2013 Document Reviewed: 06/01/2013 [facility] Patient Information 2015 [place], [place]. This information is not intended to replace advice given to you by your health care provider. Make sure you discuss any questions you have with your health care provider.

## 2014-10-24 NOTE — Progress Notes (Signed)
F/U HTN, stated taking medication as prescribed  No Hx [place]

## 2014-10-24 NOTE — Assessment & Plan Note (Signed)
Screening HIV

## 2014-10-24 NOTE — Progress Notes (Signed)
   Subjective:    Patient ID: Colin Cole, male    DOB: 05-28-1971, 44 y.o.   MRN: [id number] CC: f/u HTN  HPI  CHRONIC HYPERTENSION  Disease Monitoring  Blood pressure range: does not check   Chest pain: no   Dyspnea: no   Claudication: no   Medication compliance: yes  Medication Side Effects  Lightheadedness: no   Urinary frequency: sometimes    Edema: no   Impotence: no   Preventitive Healthcare:  Exercise: no    Soc Hx: former smoker, quit  Review of Systems  Neurological: Negative for headaches.       Objective:   Physical Exam BP 134/87 mmHg  Pulse 69  Temp(Src) 98.8 F (37.1 C) (Oral)  Resp 18  Ht 6\' 1"  (1.854 m)  Wt 290 lb (131.543 kg)  BMI 38.27 kg/m2  SpO2 96% General appearance: alert, cooperative and no distress Neck: no adenopathy, supple, symmetrical, trachea midline and thyroid not enlarged, symmetric, no tenderness/mass/nodules Lungs: clear to auscultation bilaterally Heart: regular rate and rhythm, S1, S2 normal, no murmur, click, rub or gallop Extremities: extremities normal, atraumatic, no cyanosis or edema       Assessment & Plan:

## 2014-10-25 LAB — HIV ANTIBODY (ROUTINE TESTING W REFLEX): HIV 1&2 Ab, 4th Generation: REACTIVE — AB

## 2014-10-25 LAB — HIV 1/2 CONFIRMATION
HIV-1 antibody: NEGATIVE
HIV-2 Ab: NEGATIVE

## 2014-10-29 LAB — HIV-1 RNA, QUALITATIVE, TMA: HIV-1 RNA, Qualitative, TMA: NOT DETECTED

## 2014-11-01 ENCOUNTER — Telehealth: Payer: Self-pay | Admitting: *Deleted

## 2014-11-01 NOTE — Telephone Encounter (Signed)
-----   Message from Boykin Nearing, MD sent at 10/30/2014  5:56 PM EDT ----- HIV negative

## 2014-11-01 NOTE — Telephone Encounter (Signed)
Pt aware of resutls

## 2014-11-17 ENCOUNTER — Ambulatory Visit: Payer: Self-pay | Admitting: Internal Medicine

## 2014-11-24 ENCOUNTER — Encounter: Payer: Self-pay | Admitting: Internal Medicine

## 2014-11-24 ENCOUNTER — Ambulatory Visit (INDEPENDENT_AMBULATORY_CARE_PROVIDER_SITE_OTHER): Payer: Self-pay | Admitting: Internal Medicine

## 2014-11-24 VITALS — BP 141/90 | HR 60 | Temp 97.6°F | Ht 73.0 in | Wt 286.6 lb

## 2014-11-24 DIAGNOSIS — K648 Other hemorrhoids: Secondary | ICD-10-CM

## 2014-11-24 NOTE — Progress Notes (Signed)
[facility] banding procedure note:  Patient apparently recently diagnosed with HIV.  The patient presents with symptomatic grade 3 hemorrhoids; status post banding of the left lateral hemorrhoid column on March 1 of this year. States overall symptoms have improved; he spending less time on the commode. Not taking fiber regularly.  Only one slight episode of bleeding as he reports since he was last seen. He does feel like the band previously placed has helped. He desires a second band today;   All risks, benefits, and alternative forms of therapy were described and informed consent was obtained.  In the left lateral decubitus position, a digital rectal exam revealed no abnormalities. Identified no prolapsed hemorrhoid tissue, whatsoever.  The decision was made to band the right anterior internal hemorrhoid and the [place] was used to perform band ligation without complication. Digital anorectal examination was then performed to assure proper positioning of the band; then found to be in excellent position. No pinching or pain after deployment. The patient was discharged home without pain or other issues. Dietary and behavioral recommendations were given. No complications were encountered and the patient tolerated the procedure well.  Patient will return in one month.

## 2014-11-24 NOTE — Patient Instructions (Signed)
Avoid straining.  Benefiber 2 teaspoons twice daily  Limit toilet time to 2-5 minutes  Call with any interim problems  Schedule followup appointment in 4 weeks from now   

## 2015-01-02 ENCOUNTER — Ambulatory Visit: Payer: Self-pay | Admitting: Internal Medicine

## 2015-05-11 ENCOUNTER — Ambulatory Visit: Payer: Self-pay | Attending: Family Medicine | Admitting: Family Medicine

## 2015-05-11 ENCOUNTER — Encounter: Payer: Self-pay | Admitting: Family Medicine

## 2015-05-11 VITALS — BP 139/87 | HR 72 | Temp 98.9°F | Resp 16 | Ht 73.0 in | Wt 265.0 lb

## 2015-05-11 DIAGNOSIS — Z79899 Other long term (current) drug therapy: Secondary | ICD-10-CM | POA: Insufficient documentation

## 2015-05-11 DIAGNOSIS — I10 Essential (primary) hypertension: Secondary | ICD-10-CM

## 2015-05-11 DIAGNOSIS — Z87891 Personal history of nicotine dependence: Secondary | ICD-10-CM | POA: Insufficient documentation

## 2015-05-11 MED ORDER — HYDROCHLOROTHIAZIDE 25 MG PO TABS: 25.0000 mg | ORAL_TABLET | Freq: Every day | ORAL | Status: DC

## 2015-05-11 NOTE — Patient Instructions (Addendum)
Colin Cole was seen today for hypertension.  Diagnoses and all orders for this visit:  Essential hypertension -     COMPLETE METABOLIC PANEL WITH GFR -     Lipid Panel   Well done. Blood pressure is at goal. You have lost weight.   Please call or ask your job to fax [place] documentation that you received your flu shot so your chart can be updated.  Office fax is [phone]  F/u in 6 months for HTN, sooner if needed  Dr. Adrian Blackwater

## 2015-05-11 NOTE — Progress Notes (Signed)
F/U HTN  Taking medication as prescribed  No HX tobacco

## 2015-05-11 NOTE — Progress Notes (Signed)
   Subjective:  Patient ID: Colin Cole, male    DOB: 08-Feb-1971  Age: 44 y.o. MRN: [id number]  CC: Hypertension   HPI Colin Cole presents for   1. CHRONIC HYPERTENSION  Disease Monitoring  Blood pressure range: not checking but sometimes has HA and feels pressure is high   Chest pain: no   Dyspnea: no   Claudication: no   Medication compliance: yes  Medication Side Effects  Lightheadedness: no   Urinary frequency: no   Edema: yes   Impotence: no   Preventitive Healthcare:  Exercise: walking and working hard    Diet Pattern: eating 1-2 meals per day   Salt Restriction: yes    Social History  Substance Use Topics  . Smoking status: Former Research scientist (life sciences)  . Smokeless tobacco: Never Used     Comment: Quit in 2010  . Alcohol Use: Yes   Outpatient Prescriptions Prior to Visit  Medication Sig Dispense Refill  . hydrochlorothiazide (HYDRODIURIL) 25 MG tablet Take 1 tablet (25 mg total) by mouth daily. 90 tablet 1  . hydrocortisone (ANUSOL-HC) 2.5 % rectal cream Place 1 application rectally 2 (two) times daily. 30 g 1  . loratadine (CLARITIN) 10 MG tablet Take 10 mg by mouth daily as needed for allergies.     No facility-administered medications prior to visit.    ROS Review of Systems  Constitutional: Negative for fever, chills, fatigue and unexpected weight change.  Eyes: Negative for visual disturbance.  Respiratory: Negative for cough and shortness of breath.   Cardiovascular: Negative for chest pain, palpitations and leg swelling.  Gastrointestinal: Negative for nausea, vomiting, abdominal pain, diarrhea, constipation and blood in stool.  Endocrine: Negative for polydipsia, polyphagia and polyuria.  Musculoskeletal: Positive for arthralgias. Negative for myalgias, back pain, gait problem and neck pain.  Skin: Negative for rash.  Allergic/Immunologic: Negative for immunocompromised state.  Hematological: Negative for adenopathy. Does not bruise/bleed easily.    Psychiatric/Behavioral: Negative for suicidal ideas, sleep disturbance and dysphoric mood. The patient is not nervous/anxious.     Objective:  BP 139/87 mmHg  Pulse 72  Temp(Src) 98.9 F (37.2 C) (Oral)  Resp 16  Ht 6\' 1"  (1.854 m)  Wt 265 lb (120.203 kg)  BMI 34.97 kg/m2  SpO2 96%  BP/Weight 05/11/2015 11/24/2014 6/38/4665  Systolic BP 993 570 177  Diastolic BP 87 90 87  Wt. (Lbs) 265 286.6 290  BMI 34.97 37.82 38.27   Physical Exam  Constitutional: He appears well-developed and well-nourished. No distress.  HENT:  Head: Normocephalic and atraumatic.  Neck: Normal range of motion. Neck supple. Carotid bruit is not present.  Cardiovascular: Normal rate, regular rhythm, normal heart sounds and intact distal pulses.   Pulmonary/Chest: Effort normal and breath sounds normal.  Musculoskeletal: He exhibits no edema.  Neurological: He is alert.  Skin: Skin is warm and dry. No rash noted. No erythema.  Psychiatric: He has a normal mood and affect.     Assessment & Plan:   Problem List Items Addressed This Visit    Hypertension - Primary (Chronic)   Relevant Medications   hydrochlorothiazide (HYDRODIURIL) 25 MG tablet   Other Relevant Orders   COMPLETE METABOLIC PANEL WITH GFR   Lipid Panel      No orders of the defined types were placed in this encounter.    Follow-up: Return in about 6 months (around 11/08/2015) for HTN .   Boykin Nearing MD

## 2015-05-12 LAB — LIPID PANEL
Cholesterol: 160 mg/dL (ref 125–200)
HDL: 42 mg/dL (ref >=40)
LDL Cholesterol: 110 mg/dL (ref <130)
TRIGLYCERIDES: 42 mg/dL (ref <150)
Total CHOL/HDL Ratio: 3.8 Ratio (ref <=5.0)
VLDL: 8 mg/dL (ref <30)

## 2015-05-12 LAB — COMPLETE METABOLIC PANEL WITH GFR
ALBUMIN: 4.3 g/dL (ref 3.6–5.1)
ALK PHOS: 85 U/L (ref 40–115)
ALT: 19 U/L (ref 9–46)
AST: 20 U/L (ref 10–40)
BILIRUBIN TOTAL: 0.3 mg/dL (ref 0.2–1.2)
BUN: 14 mg/dL (ref 7–25)
CALCIUM: 9.3 mg/dL (ref 8.6–10.3)
CO2: 33 mmol/L — ABNORMAL HIGH (ref 20–31)
CREATININE: 0.99 mg/dL (ref 0.60–1.35)
Chloride: 101 mmol/L (ref 98–110)
GFR, Est Non African American: >89 mL/min (ref >=60)
Glucose, Bld: 93 mg/dL (ref 65–99)
Potassium: 4.4 mmol/L (ref 3.5–5.3)
Sodium: 142 mmol/L (ref 135–146)
Total Protein: 7.8 g/dL (ref 6.1–8.1)

## 2015-05-17 ENCOUNTER — Telehealth: Payer: Self-pay | Admitting: *Deleted

## 2015-05-17 NOTE — Telephone Encounter (Signed)
LVM to return call.

## 2015-05-17 NOTE — Telephone Encounter (Signed)
Patient returned phone call. Please f/u  °

## 2015-05-17 NOTE — Telephone Encounter (Signed)
Patient returned call. Please f/u with pt.

## 2015-05-17 NOTE — Telephone Encounter (Signed)
-----   Message from Boykin Nearing, MD sent at 05/14/2015 10:23 AM EDT ----- Normal labs

## 2015-05-18 NOTE — Telephone Encounter (Signed)
Patient returned phone call. Please f/u with pt.

## 2015-06-04 ENCOUNTER — Telehealth: Payer: Self-pay | Admitting: Family Medicine

## 2015-06-04 NOTE — Telephone Encounter (Signed)
Patient is calling to obtain results.

## 2015-06-04 NOTE — Telephone Encounter (Signed)
Date of birth verified by pt Normal lab results given to pt

## 2015-08-31 MED FILL — HYDROCHLOROTHIAZIDE 25 MG T: 25 | 30 days supply | Qty: 30 | Fill #0

## 2015-10-11 MED FILL — HYDROCHLOROTHIAZIDE 25 MG T: 25 | 31 days supply | Qty: 31 | Fill #1

## 2015-12-04 MED FILL — HYDROCHLOROTHIAZIDE 25 MG T: 25 | 31 days supply | Qty: 31 | Fill #2

## 2016-01-02 MED FILL — HYDROCHLOROTHIAZIDE 25 MG T: 25 | 31 days supply | Qty: 31 | Fill #3

## 2016-02-06 MED FILL — HYDROCHLOROTHIAZIDE 25 MG T: 25 | 31 days supply | Qty: 31 | Fill #4

## 2016-06-02 ENCOUNTER — Other Ambulatory Visit: Payer: Self-pay | Admitting: Family Medicine

## 2016-06-02 DIAGNOSIS — I10 Essential (primary) hypertension: Secondary | ICD-10-CM

## 2016-07-05 ENCOUNTER — Other Ambulatory Visit: Payer: Self-pay | Admitting: Family Medicine

## 2016-07-05 DIAGNOSIS — I10 Essential (primary) hypertension: Secondary | ICD-10-CM

## 2016-07-08 ENCOUNTER — Other Ambulatory Visit: Payer: Self-pay | Admitting: Family Medicine

## 2016-07-08 DIAGNOSIS — I10 Essential (primary) hypertension: Secondary | ICD-10-CM

## 2016-07-17 ENCOUNTER — Ambulatory Visit: Payer: BLUE CROSS/BLUE SHIELD | Attending: Family Medicine | Admitting: Family Medicine

## 2016-07-17 ENCOUNTER — Encounter: Payer: Self-pay | Admitting: Family Medicine

## 2016-07-17 VITALS — BP 147/81 | HR 80 | Temp 98.6°F | Ht 73.0 in | Wt 281.2 lb

## 2016-07-17 DIAGNOSIS — M21922 Unspecified acquired deformity of left upper arm: Secondary | ICD-10-CM

## 2016-07-17 DIAGNOSIS — Z760 Encounter for issue of repeat prescription: Secondary | ICD-10-CM | POA: Insufficient documentation

## 2016-07-17 DIAGNOSIS — M5410 Radiculopathy, site unspecified: Secondary | ICD-10-CM

## 2016-07-17 DIAGNOSIS — I10 Essential (primary) hypertension: Secondary | ICD-10-CM | POA: Diagnosis not present

## 2016-07-17 DIAGNOSIS — M21822 Other specified acquired deformities of left upper arm: Secondary | ICD-10-CM | POA: Diagnosis not present

## 2016-07-17 DIAGNOSIS — Z87891 Personal history of nicotine dependence: Secondary | ICD-10-CM | POA: Diagnosis not present

## 2016-07-17 LAB — COMPLETE METABOLIC PANEL WITH GFR
ALK PHOS: 69 U/L (ref 40–115)
ALT: 22 U/L (ref 9–46)
AST: 18 U/L (ref 10–40)
Albumin: 4.3 g/dL (ref 3.6–5.1)
BILIRUBIN TOTAL: 0.4 mg/dL (ref 0.2–1.2)
BUN: 11 mg/dL (ref 7–25)
CO2: 30 mmol/L (ref 20–31)
Calcium: 9.3 mg/dL (ref 8.6–10.3)
Chloride: 100 mmol/L (ref 98–110)
Creat: 0.9 mg/dL (ref 0.60–1.35)
GFR, Est African American: >89 mL/min (ref >=60)
GLUCOSE: 86 mg/dL (ref 65–99)
Potassium: 3.6 mmol/L (ref 3.5–5.3)
SODIUM: 140 mmol/L (ref 135–146)
TOTAL PROTEIN: 8.2 g/dL — AB (ref 6.1–8.1)

## 2016-07-17 MED ORDER — GABAPENTIN 100 MG PO CAPS: 100.0000 mg | ORAL_CAPSULE | Freq: Every day | ORAL | 0 refills | Status: DC

## 2016-07-17 MED ORDER — HYDROCHLOROTHIAZIDE 25 MG PO TABS: 25.0000 mg | ORAL_TABLET | Freq: Every day | ORAL | 11 refills | Status: DC
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
## 2016-07-17 NOTE — Assessment & Plan Note (Signed)
Elevated BP Refilled HCTZ CMP today

## 2016-07-17 NOTE — Assessment & Plan Note (Signed)
asymmetrical  L posterior acromion prominence Shoulder  x-ray ordered
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
## 2016-07-17 NOTE — Patient Instructions (Signed)
Colin Cole was seen today for medication refill.  Diagnoses and all orders for this visit:  Essential hypertension -     COMPLETE METABOLIC PANEL WITH GFR -     hydrochlorothiazide (HYDRODIURIL) 25 MG tablet; Take 1 tablet (25 mg total) by mouth daily.  Radicular pain of left lower extremity -     gabapentin (NEURONTIN) 100 MG capsule; Take 1-3 capsules (100-300 mg total) by mouth at bedtime.  Acquired deformity of left shoulder -     DG Shoulder Left; Future  complete x-ray at earliest convenience Start gabapentin 100-300 mg nightly for left arm pains  F/u in 4 weeks for HTN   Dr. Adrian Blackwater    Cervical Radiculopathy Introduction Cervical radiculopathy means that a nerve in the neck is pinched or bruised. This can cause pain or loss of feeling (numbness) that runs from your neck to your arm and fingers. Follow these instructions at home: Managing pain  Take over-the-counter and prescription medicines only as told by your doctor.  If directed, put ice on the injured or painful area.  Put ice in a plastic bag.  Place a towel between your skin and the bag.  Leave the ice on for 20 minutes, 2-3 times per day.  If ice does not help, you can try using heat. Take a warm shower or warm bath, or use a heat pack as told by your doctor.  You may try a gentle neck and shoulder massage. Activity  Rest as needed. Follow instructions from your doctor about any activities to avoid.  Do exercises as told by your doctor or physical therapist. General instructions  If you were given a soft collar, wear it as told by your doctor.  Use a flat pillow when you sleep.  Keep all follow-up visits as told by your doctor. This is important. Contact a doctor if:  Your condition does not improve with treatment. Get help right away if:  Your pain gets worse and is not controlled with medicine.  You lose feeling or feel weak in your hand, arm, face, or leg.  You have a fever.  You have a  stiff neck.  You cannot control when you poop or pee (have incontinence).  You have trouble with walking, balance, or talking. This information is not intended to replace advice given to you by your health care provider. Make sure you discuss any questions you have with your health care provider. Document Released: 07/17/2011 Document Revised: 01/03/2016 Document Reviewed: 09/21/2014  2017 [employer]

## 2016-07-18 ENCOUNTER — Telehealth: Payer: Self-pay

## 2016-07-18 NOTE — Telephone Encounter (Signed)
Pt was called and a VM was left informing pt to return phone call for lab results. 

## 2016-07-21 ENCOUNTER — Telehealth: Payer: Self-pay

## 2016-07-21 NOTE — Telephone Encounter (Signed)
Pt was called and a VM was left informing pt to return phone call for lab work.

## 2016-09-04 ENCOUNTER — Telehealth: Payer: Self-pay | Admitting: Family Medicine

## 2016-09-04 DIAGNOSIS — I10 Essential (primary) hypertension: Secondary | ICD-10-CM

## 2016-09-04 NOTE — Telephone Encounter (Signed)
Pt calling stating his insurance has approved for his medication to be filled for 90 days at a time Pt requesting hydrochlorothiazide Rx to be rewritten for a 90 day supply

## 2016-09-05 MED ORDER — HYDROCHLOROTHIAZIDE 25 MG PO TABS: 25.0000 mg | ORAL_TABLET | Freq: Every day | ORAL | 3 refills | Status: DC

## 2016-09-05 NOTE — Telephone Encounter (Signed)
I changed the Rx Please inform patient He should be aware that the onsite pharmacy may default to 30 day supply but he can talk with them

## 2016-09-05 NOTE — Telephone Encounter (Signed)
Dr. Adrian Blackwater I see that this medicine was prescribed on 07-17-16 quantity of 30 with 11 refills would you be okay with me changing the quantity to 90 with 3 refills? Or would you want the patient to finish what he has first?

## 2016-09-05 NOTE — Telephone Encounter (Signed)
Contacted pt to make aware of the changes to his rx he requested pt didn't pick up left a detailed message and if he has any questions or concerns to give me a call

## 2016-09-12 ENCOUNTER — Emergency Department (HOSPITAL_BASED_OUTPATIENT_CLINIC_OR_DEPARTMENT_OTHER)
Admission: EM | Admit: 2016-09-12 | Discharge: 2016-09-12 | Disposition: A | Discharge status: Home / Self Care | Payer: BLUE CROSS/BLUE SHIELD | Attending: Emergency Medicine | Admitting: Emergency Medicine

## 2016-09-12 ENCOUNTER — Encounter (HOSPITAL_BASED_OUTPATIENT_CLINIC_OR_DEPARTMENT_OTHER): Payer: Self-pay | Admitting: Emergency Medicine

## 2016-09-12 ENCOUNTER — Emergency Department (HOSPITAL_BASED_OUTPATIENT_CLINIC_OR_DEPARTMENT_OTHER): Payer: BLUE CROSS/BLUE SHIELD

## 2016-09-12 DIAGNOSIS — S6992XA Unspecified injury of left wrist, hand and finger(s), initial encounter: Secondary | ICD-10-CM | POA: Diagnosis present

## 2016-09-12 DIAGNOSIS — Y999 Unspecified external cause status: Secondary | ICD-10-CM | POA: Insufficient documentation

## 2016-09-12 DIAGNOSIS — Y9389 Activity, other specified: Secondary | ICD-10-CM | POA: Diagnosis not present

## 2016-09-12 DIAGNOSIS — I10 Essential (primary) hypertension: Secondary | ICD-10-CM | POA: Insufficient documentation

## 2016-09-12 DIAGNOSIS — X509XXA Other and unspecified overexertion or strenuous movements or postures, initial encounter: Secondary | ICD-10-CM | POA: Insufficient documentation

## 2016-09-12 DIAGNOSIS — Y929 Unspecified place or not applicable: Secondary | ICD-10-CM | POA: Insufficient documentation

## 2016-09-12 DIAGNOSIS — Z87891 Personal history of nicotine dependence: Secondary | ICD-10-CM | POA: Insufficient documentation

## 2016-09-12 DIAGNOSIS — S60222A Contusion of left hand, initial encounter: Secondary | ICD-10-CM | POA: Diagnosis not present

## 2016-09-12 DIAGNOSIS — Z79899 Other long term (current) drug therapy: Secondary | ICD-10-CM | POA: Insufficient documentation

## 2016-09-12 NOTE — ED Triage Notes (Signed)
Patient states that he was pushing a pallet jack last night and hurt his left hand

## 2016-09-12 NOTE — ED Provider Notes (Addendum)
[facility] Provider Note   CSN: [id number] Arrival date & time: 09/12/16  1506     History   Chief Complaint Chief Complaint  Patient presents with  . Hand Injury    HPI Colin Cole is a 46 y.o. male.  Patient is a 49 your male who complains of left hand pain. He is ambidextrous. He states that he was using a pallet jack yesterday and shortly after started having pain in his left hand. He states it's gradually gotten worse since that time. He has difficulty closing his hands. He denies any numbness or weakness in the hand other than he has difficulty closing it due to the pain. He denies any prior hand injuries. He use ibuprofen at home with minimal relief in symptoms.      Past Medical History:  Diagnosis Date  . Hemorrhoids    grade 3  . hyperplastic colon polyp 2015  . Hypertension Dx 2010    Patient Active Problem List   Diagnosis Date Noted  . Radicular pain of left lower extremity 07/17/2016  . 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 History of colonic polyps   . Third degree hemorrhoids   . Hemorrhoids 08/22/2014  . Constipation 08/22/2014  . Rectal bleeding 06/05/2014  . Hypertension 06/05/2014  . Ingrown nail 05/11/2013  . Pain, foot 05/11/2013  . Onychomycosis 05/11/2013    Past Surgical History:  Procedure Laterality Date  . COLONOSCOPY N/A 09/05/2014   Dr.Rourk- grade 3 hemorrhoid, friable anal canal hemorrhoids. multiple hyperplastic appearing polyps at the rectosigmoid junction, remainder of colonoscopy is normal. bx= hyperplastic polyps  . HEMORRHOID BANDING  2016   Dr.Rourk  . HERNIA REPAIR    . HYDROCELE EXCISION         Home Medications    Prior to Admission medications   Medication Sig Start Date End Date Taking? Authorizing Provider  gabapentin (NEURONTIN) 100 MG capsule Take 1-3 capsules (100-300 mg total) by mouth at bedtime. 07/17/16   Josalyn Funches, MD  hydrochlorothiazide (HYDRODIURIL) 25 MG tablet Take  1 tablet (25 mg total) by mouth daily. 09/05/16   Josalyn Funches, MD  loratadine (CLARITIN) 10 MG tablet Take 10 mg by mouth daily as needed for allergies.    Historical Provider, MD    Family History Family History  Problem Relation Age of Onset  . Diabetes Mother   . Hypertension Mother   . Diabetes Father   . Hypertension Father     Social History Social History  Substance Use Topics  . Smoking status: Former Research scientist (life sciences)  . Smokeless tobacco: Never Used     Comment: Quit in 2010  . Alcohol use Yes     Allergies   Asa [aspirin] and Terbinafine and related   Review of Systems Review of Systems  Constitutional: Negative for fever.  Gastrointestinal: Negative for nausea and vomiting.  Musculoskeletal: Positive for arthralgias and joint swelling. Negative for back pain and neck pain.  Skin: Negative for wound.  Neurological: Negative for weakness, numbness and headaches.     Physical Exam Updated Vital Signs BP 130/84 (BP Location: Left Arm)   Pulse 90   Temp 98.7 F (37.1 C) (Oral)   Resp 18   Ht 6\' 1"  (1.854 m)   Wt 278 lb (126.1 kg)   SpO2 98%   BMI 36.68 kg/m   Physical Exam  Constitutional: He is oriented to person, place, and time. He appears well-developed and well-nourished.  HENT:  Head: Normocephalic and atraumatic.  Neck: Normal range of motion. Neck supple.  Cardiovascular: Normal rate.   Pulmonary/Chest: Effort normal.  Musculoskeletal: He exhibits tenderness. He exhibits no edema.  Patient has tenderness on the palmar side of the left hand at the thenar eminence. There is no swelling noted. No induration or fluctuance. No wounds noted. He has full range of motion of the hand with no pain on passive range of motion of the fingers. He does have discomfort on active flexion of the fingers. He has normal sensation to light touch distally. There is no pain over the median nerve. No pain over the wrist. Radial pulses are intact. Capillary refill is less than  2 distally.  Neurological: He is alert and oriented to person, place, and time.  Skin: Skin is warm and dry.  Psychiatric: He has a normal mood and affect.     ED Treatments / Results  Labs (all labs ordered are listed, but only abnormal results are displayed) Labs Reviewed - No data to display  EKG  EKG Interpretation None       Radiology Dg Hand Complete Left  Result Date: 09/12/2016 CLINICAL DATA:  Left hand pain following blunt trauma, initial encounter EXAM: LEFT HAND - COMPLETE 3+ VIEW COMPARISON:  None. FINDINGS: There is no evidence of fracture or dislocation. There is no evidence of arthropathy or other focal bone abnormality. Soft tissues are unremarkable. IMPRESSION: No acute abnormality noted. Electronically Signed   By: Inez Catalina M.D.   On: 09/12/2016 16:23    Procedures Procedures (including critical care time)  Medications Ordered in ED Medications - No data to display   Initial Impression / Assessment and Plan / ED Course  I have reviewed the triage vital signs and the nursing notes.  Pertinent labs & imaging results that were available during my care of the patient were reviewed by me and considered in my medical decision making (see chart for details).     There is no evidence of bony injury. This is likely contusion. He's neurologically intact. He was placed in a Velcro wrist splint. He was given referral to follow-up with Dr. Barbaraann Barthel if his symptoms are not improving. He was encouraged to use ibuprofen and ice for symptomatic relief.  Final Clinical Impressions(s) / ED Diagnoses   Final diagnoses:  Contusion of left hand, initial encounter    New Prescriptions New Prescriptions   No medications on file     Malvin Johns, MD 09/12/16 [place], MD 09/12/16 1646

## 2016-09-22 ENCOUNTER — Ambulatory Visit (HOSPITAL_COMMUNITY)
Admission: RE | Admit: 2016-09-22 | Discharge: 2016-09-22 | Disposition: A | Discharge status: Home / Self Care | Payer: BLUE CROSS/BLUE SHIELD | Source: Ambulatory Visit | Attending: Family Medicine | Admitting: Family Medicine

## 2016-09-22 ENCOUNTER — Ambulatory Visit: Payer: BLUE CROSS/BLUE SHIELD | Attending: Family Medicine | Admitting: Family Medicine

## 2016-09-22 ENCOUNTER — Encounter: Payer: Self-pay | Admitting: Family Medicine

## 2016-09-22 VITALS — BP 136/86 | HR 80 | Temp 97.3°F | Ht 73.0 in | Wt 277.4 lb

## 2016-09-22 DIAGNOSIS — M21922 Unspecified acquired deformity of left upper arm: Secondary | ICD-10-CM | POA: Insufficient documentation

## 2016-09-22 DIAGNOSIS — I10 Essential (primary) hypertension: Secondary | ICD-10-CM | POA: Diagnosis not present

## 2016-09-22 DIAGNOSIS — B351 Tinea unguium: Secondary | ICD-10-CM | POA: Diagnosis not present

## 2016-09-22 DIAGNOSIS — N400 Enlarged prostate without lower urinary tract symptoms: Secondary | ICD-10-CM

## 2016-09-22 LAB — HEMOCCULT GUIAC POC 1CARD (OFFICE): FECAL OCCULT BLD: NEGATIVE

## 2016-09-22 LAB — LIPID PANEL
Cholesterol: 155 mg/dL (ref <200)
HDL: 35 mg/dL — ABNORMAL LOW (ref >40)
LDL Cholesterol: 95 mg/dL (ref <100)
Total CHOL/HDL Ratio: 4.4 Ratio (ref <5.0)
Triglycerides: 123 mg/dL (ref <150)
VLDL: 25 mg/dL (ref <30)

## 2016-09-22 LAB — PSA: PSA: 0.9 ng/mL (ref <=4.0)

## 2016-09-22 LAB — COMPLETE METABOLIC PANEL WITH GFR
ALK PHOS: 87 U/L (ref 40–115)
ALT: 18 U/L (ref 9–46)
AST: 17 U/L (ref 10–40)
Albumin: 4.2 g/dL (ref 3.6–5.1)
BUN: 17 mg/dL (ref 7–25)
CALCIUM: 9.5 mg/dL (ref 8.6–10.3)
CO2: 29 mmol/L (ref 20–31)
Chloride: 103 mmol/L (ref 98–110)
Creat: 0.9 mg/dL (ref 0.60–1.35)
GFR, Est African American: >89 mL/min (ref >=60)
GFR, Est Non African American: >89 mL/min (ref >=60)
GLUCOSE: 89 mg/dL (ref 65–99)
Potassium: 3.7 mmol/L (ref 3.5–5.3)
SODIUM: 139 mmol/L (ref 135–146)
TOTAL PROTEIN: 8.2 g/dL — AB (ref 6.1–8.1)
Total Bilirubin: 0.4 mg/dL (ref 0.2–1.2)

## 2016-09-22 MED ORDER — TERBINAFINE HCL 250 MG PO TABS: 250.0000 mg | ORAL_TABLET | Freq: Every day | ORAL | 2 refills | Status: AC

## 2016-09-22 MED ORDER — HYDROCHLOROTHIAZIDE 25 MG PO TABS: 25.0000 mg | ORAL_TABLET | Freq: Every day | ORAL | 3 refills | Status: DC

## 2016-09-22 NOTE — Assessment & Plan Note (Signed)
Well controlled Refilled HCTZ Check CMP

## 2016-09-22 NOTE — Assessment & Plan Note (Signed)
X-ray ordered Patient will complete

## 2016-09-22 NOTE — Assessment & Plan Note (Addendum)
Rash with lamisil cream only Patient reports that he tolerated the pills in the past without rash or intolerance  Plan: 3 month lamisil course

## 2016-09-22 NOTE — Patient Instructions (Addendum)
Colin Cole was seen today for annual exam.  Diagnoses and all orders for this visit:  Enlarged prostate -     Hemoccult - 1 Card (office) -     PSA  Essential hypertension -     hydrochlorothiazide (HYDRODIURIL) 25 MG tablet; Take 1 tablet (25 mg total) by mouth daily. -     COMPLETE METABOLIC PANEL WITH GFR -     Lipid Panel  Onychomycosis -     terbinafine (LAMISIL) 250 MG tablet; Take 1 tablet (250 mg total) by mouth daily.  Acquired deformity of left shoulder   Please complete ordered shoulder x-ray   F/u in 6 months for HTN sooner if needed  Dr. Adrian [place]

## 2016-09-22 NOTE — Progress Notes (Signed)
SUBJECTIVE:  Colin Cole is a 46 y.o. male presenting for his annual checkup. He has not acute complaints    Current Outpatient Prescriptions  Medication Sig Dispense Refill  . gabapentin (NEURONTIN) 100 MG capsule Take 1-3 capsules (100-300 mg total) by mouth at bedtime. 90 capsule 0  . hydrochlorothiazide (HYDRODIURIL) 25 MG tablet Take 1 tablet (25 mg total) by mouth daily. 90 tablet 3  . loratadine (CLARITIN) 10 MG tablet Take 10 mg by mouth daily as needed for allergies.     No current facility-administered medications for this visit.    Allergies: Asa [aspirin] and Terbinafine and related   Past Surgical History:  Procedure Laterality Date  . COLONOSCOPY N/A 09/05/2014   Dr.Rourk- grade 3 hemorrhoid, friable anal canal hemorrhoids. multiple hyperplastic appearing polyps at the rectosigmoid junction, remainder of colonoscopy is normal. bx= hyperplastic polyps  . HEMORRHOID BANDING  2016   Dr.Rourk  . HERNIA REPAIR    . HYDROCELE EXCISION     ROS:  Feeling well. No dyspnea or chest pain on exertion. No abdominal pain, change in bowel habits, black or bloody stools. No urinary tract or prostatic symptoms. No neurological complaints.  OBJECTIVE:  The patient appears well, alert, oriented x 3, in no distress.  BP 136/86 (BP Location: Left Arm, Patient Position: Sitting, Cuff Size: Large)   Pulse 80   Temp 97.3 F (36.3 C) (Oral)   Ht 6\' 1"  (1.854 m)   Wt 277 lb 6.4 oz (125.8 kg)   SpO2 96%   BMI 36.60 kg/m  ENT normal.  Neck supple. No adenopathy or thyromegaly. PERLA. Lungs are clear, good air entry, no wheezes, rhonchi or rales. S1 and S2 normal, no murmurs, regular rate and rhythm. Abdomen is soft without tenderness, guarding, mass or organomegaly. GU exam: PROSTATE EXAM: smooth and symmetric without nodules or tenderness, enlarged, RECTAL EXAM: negative without mass, lesions or tenderness, HERNIA EXAM: no hernias found on exam, TESTICULAR EXAM: normal, no masses, R > L,  PENIS: normal without lesions or discharge, circumcised, SCROTUM: normal, no masses.  Extremities show no edema, normal peripheral pulses. Neurological is normal without focal findings. Thickened and slightly yellow toenails   Colin Cole was seen today for annual exam.  Diagnoses and all orders for this visit:  Enlarged prostate -     Hemoccult - 1 Card (office) -     PSA  Essential hypertension -     hydrochlorothiazide (HYDRODIURIL) 25 MG tablet; Take 1 tablet (25 mg total) by mouth daily. -     COMPLETE METABOLIC PANEL WITH GFR -     Lipid Panel

## 2016-09-24 ENCOUNTER — Telehealth: Payer: Self-pay

## 2016-09-24 NOTE — Telephone Encounter (Signed)
Pt was called and informed of X-ray results. 

## 2016-10-24 ENCOUNTER — Emergency Department (HOSPITAL_BASED_OUTPATIENT_CLINIC_OR_DEPARTMENT_OTHER)
Admission: EM | Admit: 2016-10-24 | Discharge: 2016-10-24 | Disposition: A | Discharge status: Home / Self Care | Payer: BLUE CROSS/BLUE SHIELD | Attending: Emergency Medicine | Admitting: Emergency Medicine

## 2016-10-24 ENCOUNTER — Encounter (HOSPITAL_BASED_OUTPATIENT_CLINIC_OR_DEPARTMENT_OTHER): Payer: Self-pay | Admitting: Emergency Medicine

## 2016-10-24 DIAGNOSIS — Z87891 Personal history of nicotine dependence: Secondary | ICD-10-CM | POA: Diagnosis not present

## 2016-10-24 DIAGNOSIS — M79641 Pain in right hand: Secondary | ICD-10-CM | POA: Diagnosis present

## 2016-10-24 DIAGNOSIS — M779 Enthesopathy, unspecified: Secondary | ICD-10-CM | POA: Diagnosis not present

## 2016-10-24 DIAGNOSIS — Z79899 Other long term (current) drug therapy: Secondary | ICD-10-CM | POA: Insufficient documentation

## 2016-10-24 DIAGNOSIS — I10 Essential (primary) hypertension: Secondary | ICD-10-CM | POA: Insufficient documentation

## 2016-10-24 MED ORDER — DICLOFENAC SODIUM 1 % TD GEL: 4.0000 g | Freq: Four times a day (QID) | TRANSDERMAL | 0 refills | Status: DC

## 2016-10-24 MED ORDER — IBUPROFEN 800 MG PO TABS
800.0000 mg | ORAL_TABLET | Freq: Once | ORAL | Status: AC
  Administered 2016-10-24: 800 mg via ORAL
  Filled 2016-10-24: qty 1

## 2016-10-24 NOTE — ED Triage Notes (Signed)
Fingers of rt hand are cool

## 2016-10-24 NOTE — ED Provider Notes (Signed)
[facility] Provider Note   CSN: [id number] Arrival date & time: 10/24/16  0254     History   Chief Complaint Chief Complaint  Patient presents with  . Hand Pain    HPI Colin Cole is a 46 y.o. male.  The history is provided by the patient.  Hand Pain  This is a recurrent problem. The current episode started more than 1 week ago. The problem occurs constantly. The problem has not changed since onset.Pertinent negatives include no chest pain, no abdominal pain, no headaches and no shortness of breath. Nothing aggravates the symptoms. Nothing relieves the symptoms. Treatments tried: vicodin. The treatment provided no relief.  Hand pain and volar right forearm pain.  Uses machinery at work, doing repetitive work. No trauma.  Vicodin is not helping.  No weakness no numbness no changes in the skin.    Past Medical History:  Diagnosis Date  . Hemorrhoids    grade 3  . hyperplastic colon polyp 2015  . Hypertension Dx 2010    Patient Active Problem List   Diagnosis Date Noted  . Radicular pain of left lower extremity 07/17/2016  . 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 History of colonic polyps   . Third degree hemorrhoids   . Hemorrhoids 08/22/2014  . Constipation 08/22/2014  . Hypertension 06/05/2014  . Ingrown nail 05/11/2013  . Pain, foot 05/11/2013  . Onychomycosis 05/11/2013    Past Surgical History:  Procedure Laterality Date  . COLONOSCOPY N/A 09/05/2014   Dr.Rourk- grade 3 hemorrhoid, friable anal canal hemorrhoids. multiple hyperplastic appearing polyps at the rectosigmoid junction, remainder of colonoscopy is normal. bx= hyperplastic polyps  . HEMORRHOID BANDING  2016   Dr.Rourk  . HERNIA REPAIR    . HYDROCELE EXCISION         Home Medications    Prior to Admission medications   Medication Sig Start Date End Date Taking? Authorizing Provider  HYDROcodone-acetaminophen (NORCO/VICODIN) 5-325 MG tablet Take 1 tablet by mouth every  6 (six) hours as needed for moderate pain.   Yes Historical Provider, MD  diclofenac sodium (VOLTAREN) 1 % GEL Apply 4 g topically 4 (four) times daily. 10/24/16   Cartina Brousseau, MD  hydrochlorothiazide (HYDRODIURIL) 25 MG tablet Take 1 tablet (25 mg total) by mouth daily. 09/22/16   Josalyn Funches, MD  loratadine (CLARITIN) 10 MG tablet Take 10 mg by mouth daily as needed for allergies.    Historical Provider, MD  terbinafine (LAMISIL) 250 MG tablet Take 1 tablet (250 mg total) by mouth daily. 09/22/16   Boykin Nearing, MD    Family History Family History  Problem Relation Age of Onset  . Diabetes Mother   . Hypertension Mother   . Diabetes Father   . Hypertension Father     Social History Social History  Substance Use Topics  . Smoking status: Former Research scientist (life sciences)  . Smokeless tobacco: Never Used     Comment: Quit in 2010  . Alcohol use Yes     Allergies   Asa [aspirin] and Terbinafine and related   Review of Systems Review of Systems  Constitutional: Negative for fever.  Respiratory: Negative for shortness of breath.   Cardiovascular: Negative for chest pain.  Gastrointestinal: Negative for abdominal pain.  Musculoskeletal: Positive for arthralgias. Negative for back pain, gait problem, joint swelling, myalgias, neck pain and neck stiffness.  Skin: Negative for rash and wound.  Neurological: Negative for weakness, numbness and headaches.  All other systems reviewed and are negative.  Physical Exam Updated Vital Signs BP (!) 139/102 (BP Location: Left Arm)   Pulse 82   Temp 98.7 F (37.1 C) (Oral)   Resp (!) 22   Ht 6' (1.829 m)   Wt 278 lb (126.1 kg)   SpO2 97%   BMI 37.70 kg/m   Physical Exam  Constitutional: He is oriented to person, place, and time. He appears well-developed and well-nourished. No distress.  HENT:  Head: Normocephalic and atraumatic.  Mouth/Throat: Oropharynx is clear and moist. No oropharyngeal exudate.  Eyes: Conjunctivae are normal.  Pupils are equal, round, and reactive to light.  Neck: Normal range of motion. Neck supple.  Cardiovascular: Normal rate, regular rhythm, normal heart sounds and intact distal pulses.   Pulmonary/Chest: Effort normal and breath sounds normal. He has no wheezes. He has no rales.  Abdominal: Soft. Bowel sounds are normal. He exhibits no mass. There is no tenderness. There is no rebound and no guarding.  Musculoskeletal: Normal range of motion.       Right wrist: Normal. He exhibits normal range of motion, no tenderness, no bony tenderness, no swelling, no effusion, no crepitus, no deformity and no laceration.       Right forearm: He exhibits no swelling, no edema, no deformity and no laceration.       Right hand: Normal. He exhibits normal capillary refill. Normal sensation noted. Normal strength noted.  Pain along the tendon  Neurological: He is alert and oriented to person, place, and time.  Skin: Skin is warm and dry. Capillary refill takes less than 2 seconds.  Psychiatric: He has a normal mood and affect.     ED Treatments / Results   Vitals:   10/24/16 0308  BP: (!) 139/102  Pulse: 82  Resp: (!) 22  Temp: 98.7 F (37.1 C)     Procedures Procedures (including critical care time)  Medications Ordered in ED Medications  ibuprofen (ADVIL,MOTRIN) tablet 800 mg (not administered)       Final Clinical Impressions(s) / ED Diagnoses   Final diagnoses:  Tendinitis  Has had this problem on the opposite side, was told to follow up and hasn't.  Is taking vicodin but no NSAIDs.  I saw the allergy to aspirin but patient states he can take ibuprofen and other NSAIDs. He was asked this specifically by me.  Ice elevation and voltaren gel and follow up with sports medicine.  Without trauma there is no indication for imaging.    Patient is well appearing, normal vital signs. Based on history and exam patient has been appropriately medically screened and emergency conditions excluded.  Patient is stable for discharge at this time. Strict return precautions given for weakness, numbness, chest pain, worsening symptomsor anyfurther problems or concerns. Follow up with your PMD in 2 days for recheck.     New Prescriptions New Prescriptions   DICLOFENAC SODIUM (VOLTAREN) 1 % GEL    Apply 4 g topically 4 (four) times daily.     Veatrice Kells, MD 10/24/16 [phone]

## 2016-10-24 NOTE — ED Triage Notes (Signed)
Rt hand pain   No known injury  Onset x 3 weeks positiveradial pulse

## 2016-10-24 NOTE — ED Notes (Signed)
ED Provider at bedside. 

## 2016-10-27 ENCOUNTER — Encounter: Payer: Self-pay | Admitting: Family Medicine

## 2016-10-27 ENCOUNTER — Ambulatory Visit (INDEPENDENT_AMBULATORY_CARE_PROVIDER_SITE_OTHER): Payer: BLUE CROSS/BLUE SHIELD | Admitting: Family Medicine

## 2016-10-27 DIAGNOSIS — M79641 Pain in right hand: Secondary | ICD-10-CM | POA: Diagnosis not present

## 2016-10-27 DIAGNOSIS — M79642 Pain in left hand: Secondary | ICD-10-CM | POA: Diagnosis not present

## 2016-10-27 NOTE — Patient Instructions (Signed)
You have a flexor tendinitis of your hand. This has improved and will likely stay that way as you've recently switch jobs. Continue the voltaren gel up to 4 times a day as needed - can switch to aleve 2 tabs twice a day with food if you run out of this Consider hand therapy, brace if this worsens again. Follow up with me in 1 month or as needed.

## 2016-10-30 DIAGNOSIS — M79642 Pain in left hand: Principal | ICD-10-CM

## 2016-10-30 DIAGNOSIS — M79641 Pain in right hand: Secondary | ICD-10-CM | POA: Insufficient documentation

## 2016-10-30 NOTE — Progress Notes (Signed)
PCP: Minerva Ends, MD  Subjective:   HPI: Patient is a 46 y.o. male here for right hand swelling.  Patient denies known injury or trauma. Has had pain off and on for 6 months in both hands. Uses hands a lot at work. 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 As he hasn't been doing this as much his symptoms have improved. Pain now 0/10 in both, gets to be a soreness. Fingers will freeze and swell by end of day. Pain can go up into forearm especially on right. Right handed. Using a topical cream and taking ibuprofen. No skin changes, numbness.  Past Medical History:  Diagnosis Date  . Hemorrhoids    grade 3  . hyperplastic colon polyp 2015  . Hypertension Dx 2010    Current Outpatient Prescriptions on File Prior to Visit  Medication Sig Dispense Refill  . diclofenac sodium (VOLTAREN) 1 % GEL Apply 4 g topically 4 (four) times daily. 1 Tube 0  . hydrochlorothiazide (HYDRODIURIL) 25 MG tablet Take 1 tablet (25 mg total) by mouth daily. 90 tablet 3  . HYDROcodone-acetaminophen (NORCO/VICODIN) 5-325 MG tablet Take 1 tablet by mouth every 6 (six) hours as needed for moderate pain.    Marland Kitchen loratadine (CLARITIN) 10 MG tablet Take 10 mg by mouth daily as needed for allergies.    Marland Kitchen terbinafine (LAMISIL) 250 MG tablet Take 1 tablet (250 mg total) by mouth daily. 30 tablet 2   No current facility-administered medications on file prior to visit.     Past Surgical History:  Procedure Laterality Date  . COLONOSCOPY N/A 09/05/2014   Dr.Rourk- grade 3 hemorrhoid, friable anal canal hemorrhoids. multiple hyperplastic appearing polyps at the rectosigmoid junction, remainder of colonoscopy is normal. bx= hyperplastic polyps  . HEMORRHOID BANDING  2016   Dr.Rourk  . HERNIA REPAIR    . HYDROCELE EXCISION      Allergies  Allergen Reactions  . Asa [Aspirin]   . Terbinafine And Related Rash    Social History   Social History  . Marital status:  Divorced    Spouse name: N/A  . Number of children: N/A  . Years of education: N/A   Occupational History  . Not on file.   Social History Main Topics  . Smoking status: Former Research scientist (life sciences)  . Smokeless tobacco: Never Used     Comment: Quit in 2010  . Alcohol use Yes  . Drug use: No  . Sexual activity: Not on file   Other Topics Concern  . Not on file   Social History Narrative  . No narrative on file    Family History  Problem Relation Age of Onset  . Diabetes Mother   . Hypertension Mother   . Diabetes Father   . Hypertension Father     BP 127/83   Pulse 76   Ht 6\' 1"  (1.854 m)   Wt 278 lb (126.1 kg)   BMI 36.68 kg/m   Review of Systems: See HPI above.     Objective:  Physical Exam:  Gen: NAD, comfortable in exam room  Right hand/wrist: No gross deformity, swelling, bruising, atrophy. No TTP currently. FROM digits with 5/5 strength and no pain. Negative tinels, phalens, finkelsteins. NVI distally. Sensation intact to light touch.  Left hand/wrist: No gross deformity, swelling, bruising, atrophy. No TTP currently. FROM digits with 5/5 strength and no pain. Negative tinels, phalens, finkelsteins. NVI distally. Sensation intact to light touch.  Assessment & Plan:  1. Bilateral hand pain - improved since switching to different job.  Consistent with likely flexor tendinitis of hands.  Continue voltaren gel as needed.  Consider therapy, brace if worsens again.  F/u in 1 month or prn.

## 2016-10-30 NOTE — Assessment & Plan Note (Signed)
improved since switching to different job.  Consistent with likely flexor tendinitis of hands.  Continue voltaren gel as needed.  Consider therapy, brace if worsens again.  F/u in 1 month or prn.

## 2017-08-20 ENCOUNTER — Ambulatory Visit: Payer: Managed Care, Other (non HMO) | Attending: Internal Medicine | Admitting: Physician Assistant

## 2017-08-20 VITALS — BP 142/91 | HR 69 | Temp 97.6°F | Resp 18 | Ht 73.0 in | Wt 312.0 lb

## 2017-08-20 DIAGNOSIS — E663 Overweight: Secondary | ICD-10-CM | POA: Diagnosis not present

## 2017-08-20 DIAGNOSIS — I10 Essential (primary) hypertension: Secondary | ICD-10-CM | POA: Diagnosis present

## 2017-08-20 DIAGNOSIS — Z6841 Body Mass Index (BMI) 40.0 and over, adult: Secondary | ICD-10-CM | POA: Insufficient documentation

## 2017-08-20 MED ORDER — HYDROCHLOROTHIAZIDE 25 MG PO TABS: 25.0000 mg | ORAL_TABLET | Freq: Every day | ORAL | 3 refills | Status: AC

## 2017-08-20 NOTE — Patient Instructions (Addendum)
Check blood pressure 3-5 times weekly-goal is 120/80, if BP is running higher than that, schedule an appt.    Hypertension Hypertension is another name for high blood pressure. High blood pressure forces your heart to work harder to pump blood. This can cause problems over time. There are two numbers in a blood pressure reading. There is a top number (systolic) over a bottom number (diastolic). It is best to have a blood pressure below 120/80. Healthy choices can help lower your blood pressure. You may need medicine to help lower your blood pressure if:  Your blood pressure cannot be lowered with healthy choices.  Your blood pressure is higher than 130/80.  Follow these instructions at home: Eating and drinking  If directed, follow the DASH eating plan. This diet includes: ? Filling half of your plate at each meal with fruits and vegetables. ? Filling one quarter of your plate at each meal with whole grains. Whole grains include whole wheat pasta, brown rice, and whole grain bread. ? Eating or drinking low-fat dairy products, such as skim milk or low-fat yogurt. ? Filling one quarter of your plate at each meal with low-fat (lean) proteins. Low-fat proteins include fish, skinless chicken, eggs, beans, and tofu. ? Avoiding fatty meat, cured and processed meat, or chicken with skin. ? Avoiding premade or processed food.  Eat less than 1,500 mg of salt (sodium) a day.  Limit alcohol use to no more than 1 drink a day for nonpregnant women and 2 drinks a day for men. One drink equals 12 oz of beer, 5 oz of wine, or 1 oz of hard liquor. Lifestyle  Work with your doctor to stay at a healthy weight or to lose weight. Ask your doctor what the best weight is for you.  Get at least 30 minutes of exercise that causes your heart to beat faster (aerobic exercise) most days of the week. This may include walking, swimming, or biking.  Get at least 30 minutes of exercise that strengthens your muscles  (resistance exercise) at least 3 days a week. This may include lifting weights or pilates.  Do not use any products that contain nicotine or tobacco. This includes cigarettes and e-cigarettes. If you need help quitting, ask your doctor.  Check your blood pressure at home as told by your doctor.  Keep all follow-up visits as told by your doctor. This is important. Medicines  Take over-the-counter and prescription medicines only as told by your doctor. Follow directions carefully.  Do not skip doses of blood pressure medicine. The medicine does not work as well if you skip doses. Skipping doses also puts you at risk for problems.  Ask your doctor about side effects or reactions to medicines that you should watch for. Contact a doctor if:  You think you are having a reaction to the medicine you are taking.  You have headaches that keep coming back (recurring).  You feel dizzy.  You have swelling in your ankles.  You have trouble with your vision. Get help right away if:  You get a very bad headache.  You start to feel confused.  You feel weak or numb.  You feel faint.  You get very bad pain in your: ? Chest. ? Belly (abdomen).  You throw up (vomit) more than once.  You have trouble breathing. Summary  Hypertension is another name for high blood pressure.  Making healthy choices can help lower blood pressure. If your blood pressure cannot be controlled with healthy choices,  you may need to take medicine. This information is not intended to replace advice given to you by your health care provider. Make sure you discuss any questions you have with your health care provider. Document Released: 01/14/2008 Document Revised: 06/25/2016 Document Reviewed: 06/25/2016 Elsevier Interactive Patient Education  [employer].

## 2017-08-20 NOTE — Progress Notes (Signed)
Patient ID: Colin Cole, male   DOB: 11-Mar-1971, 47 y.o.   MRN: [id number]   Bowden Boody, is a 47 y.o. male  DUK:[id number]  BJS:[id number]  DOB - 05-07-1971  Subjective:  Chief Complaint and HPI: Colin Cole is a 47 y.o. male here today for BP med RF.  He hasn't taken HCTZ today.  Says he checks BP at home and it is usu controlled.  Denies CP/dizziness/SOB.He does not exercise or eat properly.  He says he is going to start working on both.    ROS:   Constitutional:  No f/c, No night sweats, No unexplained weight loss. EENT:  No vision changes, No blurry vision, No hearing changes. No mouth, throat, or ear problems.  Respiratory: No cough, No SOB Cardiac: No CP, no palpitations GI:  No abd pain, No N/V/D. GU: No Urinary s/sx Musculoskeletal: No joint pain Neuro: No headache, no dizziness, no motor weakness.  Skin: No rash Endocrine:  No polydipsia. No polyuria.  Psych: Denies SI/HI  No problems updated.  ALLERGIES: Allergies  Allergen Reactions  . Asa [Aspirin]   . Terbinafine And Related Rash    PAST MEDICAL HISTORY: Past Medical History:  Diagnosis Date  . Hemorrhoids    grade 3  . hyperplastic colon polyp 2015  . Hypertension Dx 2010    MEDICATIONS AT HOME: Prior to Admission medications   Medication Sig Start Date End Date Taking? Authorizing Provider  diclofenac sodium (VOLTAREN) 1 % GEL Apply 4 g topically 4 (four) times daily. 10/24/16  Yes Palumbo, April, MD  hydrochlorothiazide (HYDRODIURIL) 25 MG tablet Take 1 tablet (25 mg total) by mouth daily. 08/20/17  Yes Mckinleigh Schuchart M, PA-C  loratadine (CLARITIN) 10 MG tablet Take 10 mg by mouth daily as needed for allergies.   Yes [provider]  terbinafine (LAMISIL) 250 MG tablet Take 1 tablet (250 mg total) by mouth daily. 09/22/16  Yes Funches, Adriana Mccallum, MD     Objective:  EXAM:   Vitals:   08/20/17 0900  BP: (!) 142/91  Pulse: 69  Resp: 18  Temp: 97.6 F (36.4 C)  TempSrc: Oral  SpO2:  100%  Weight: (!) 312 lb (141.5 kg)  Height: 6\' 1"  (1.854 m)    General appearance : A&OX3. NAD. Non-toxic-appearing HEENT: Atraumatic and Normocephalic.  PERRLA. EOM intact.   Neck: supple, no JVD. No cervical lymphadenopathy. No thyromegaly Chest/Lungs:  Breathing-non-labored, Good air entry bilaterally, breath sounds normal without rales, rhonchi, or wheezing  CVS: S1 S2 regular, no murmurs, gallops, rubs  Extremities: Bilateral Lower Ext shows no edema, both legs are warm to touch with = pulse throughout Neurology:  CN II-XII grossly intact, Non focal.   Psych:  TP linear. J/I WNL. Normal speech. Appropriate eye contact and affect.  Skin:  No Rash  Data Review No results found for: HGBA1C   Assessment & Plan   1. Essential hypertension Not at goal today(but didn't take meds today).  home readings ok. Check blood pressure 3-5 times weekly-goal is 120/80, if BP is running higher than that, schedule an appt.  - hydrochlorothiazide (HYDRODIURIL) 25 MG tablet; Take 1 tablet (25 mg total) by mouth daily.  Dispense: 90 tablet; Refill: 3 - Comprehensive metabolic panel  2. Overweight Encouraged him on proper diet and exercise.       Patient have been counseled extensively about nutrition and exercise  Return in about 6 months (around 02/17/2018) for assign new PCP; htn.  The patient was given clear instructions to go  to ER or return to medical center if symptoms don't improve, worsen or new problems develop. The patient verbalized understanding. The patient was told to call to get lab results if they haven't heard anything in the next week.     Freeman Caldron, PA-C [facility] and [place], [place]   08/20/2017, 9:12 AM

## 2017-08-21 LAB — COMPREHENSIVE METABOLIC PANEL
ALK PHOS: 86 IU/L (ref 39–117)
ALT: 39 IU/L (ref 0–44)
AST: 30 IU/L (ref 0–40)
Albumin/Globulin Ratio: 1.2 (ref 1.2–2.2)
Albumin: 4.3 g/dL (ref 3.5–5.5)
BILIRUBIN TOTAL: 0.3 mg/dL (ref 0.0–1.2)
BUN/Creatinine Ratio: 16 (ref 9–20)
BUN: 15 mg/dL (ref 6–24)
CO2: 25 mmol/L (ref 20–29)
Calcium: 9.3 mg/dL (ref 8.7–10.2)
Chloride: 102 mmol/L (ref 96–106)
Creatinine, Ser: 0.92 mg/dL (ref 0.76–1.27)
GFR calc Af Amer: 114 mL/min/{1.73_m2} (ref >59)
GFR calc non Af Amer: 99 mL/min/{1.73_m2} (ref >59)
Globulin, Total: 3.6 g/dL (ref 1.5–4.5)
Glucose: 115 mg/dL — ABNORMAL HIGH (ref 65–99)
Potassium: 4.2 mmol/L (ref 3.5–5.2)
Sodium: 142 mmol/L (ref 134–144)
Total Protein: 7.9 g/dL (ref 6.0–8.5)

## 2017-08-27 ENCOUNTER — Telehealth: Payer: Self-pay | Admitting: *Deleted

## 2017-08-27 NOTE — Telephone Encounter (Signed)
-----   Message from Argentina Donovan, [place] sent at 08/25/2017  2:24 PM EST ----- Please call patient.  Blood sugar a little high, so cut back on sugar, desserts, candy, soda, etc.  Follow-up as planned. Thanks, Freeman Caldron, PA-C

## 2017-08-27 NOTE — Telephone Encounter (Signed)
Patient verified DOB Patient is aware of blood sugar being high and needing to cut back on sugar, deserts, candy and soda. No further questions.

## 2018-10-17 IMAGING — DX DG SHOULDER 2+V*L*
3 series · 3 of 3 positions shown · non-contrast
Comparison: None.

CLINICAL DATA: Posterior left shoulder pain for 2 months. Initial
encounter.

EXAM:
LEFT SHOULDER - 2+ VIEW

[w shoulder internal left]
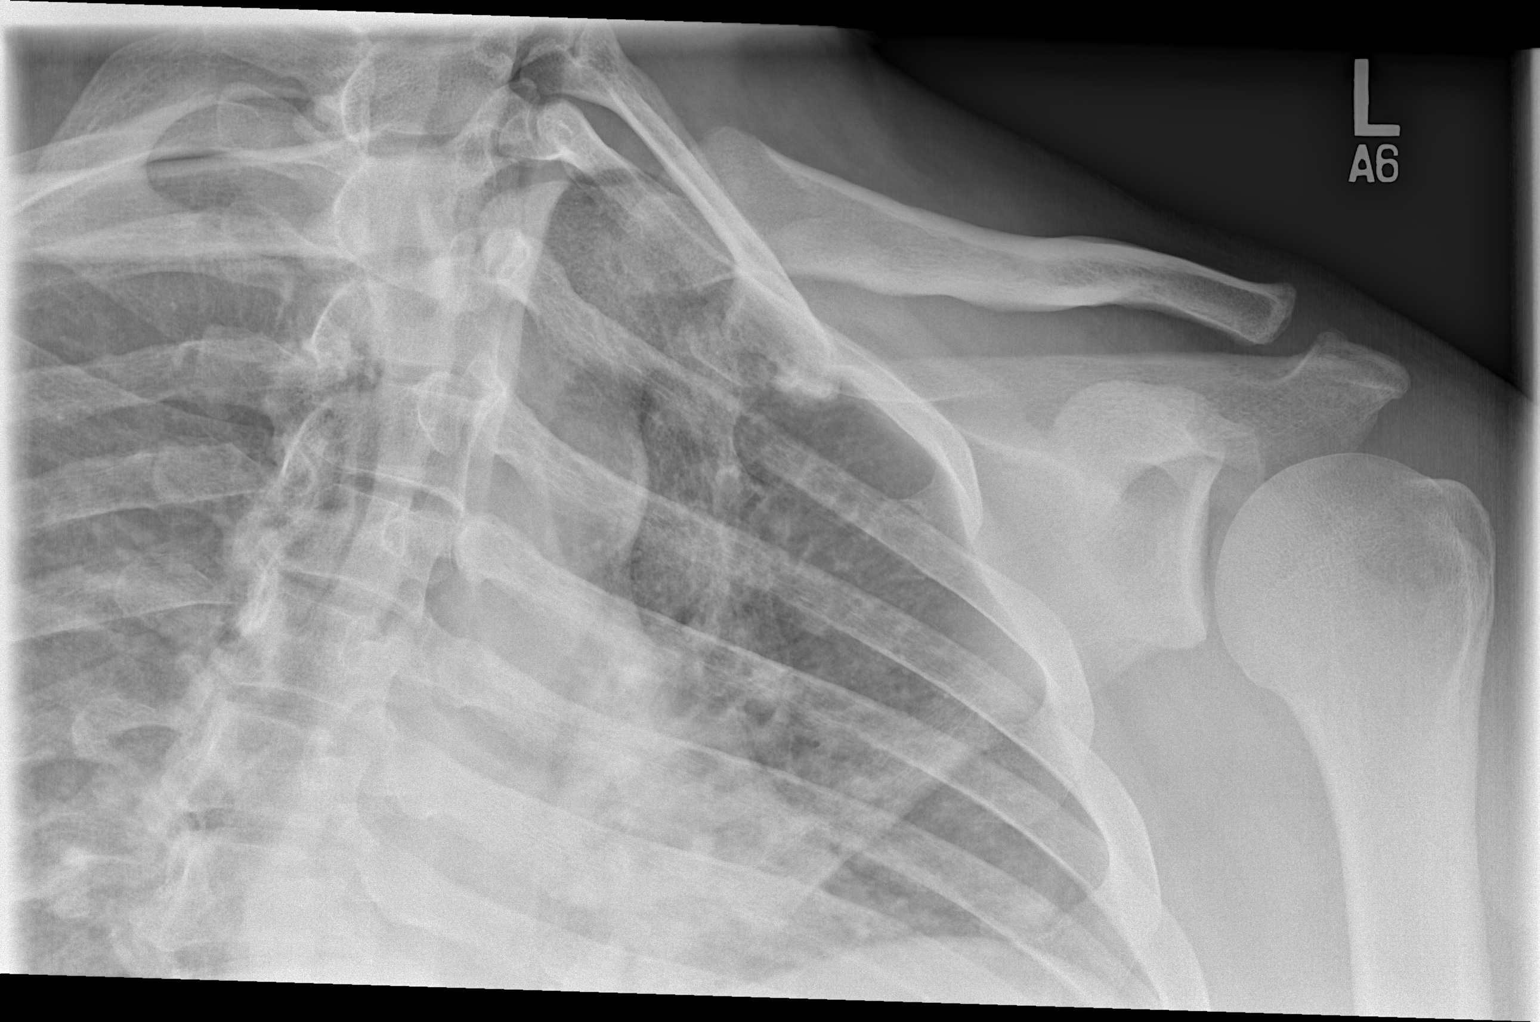

[w shoulder y-view left]
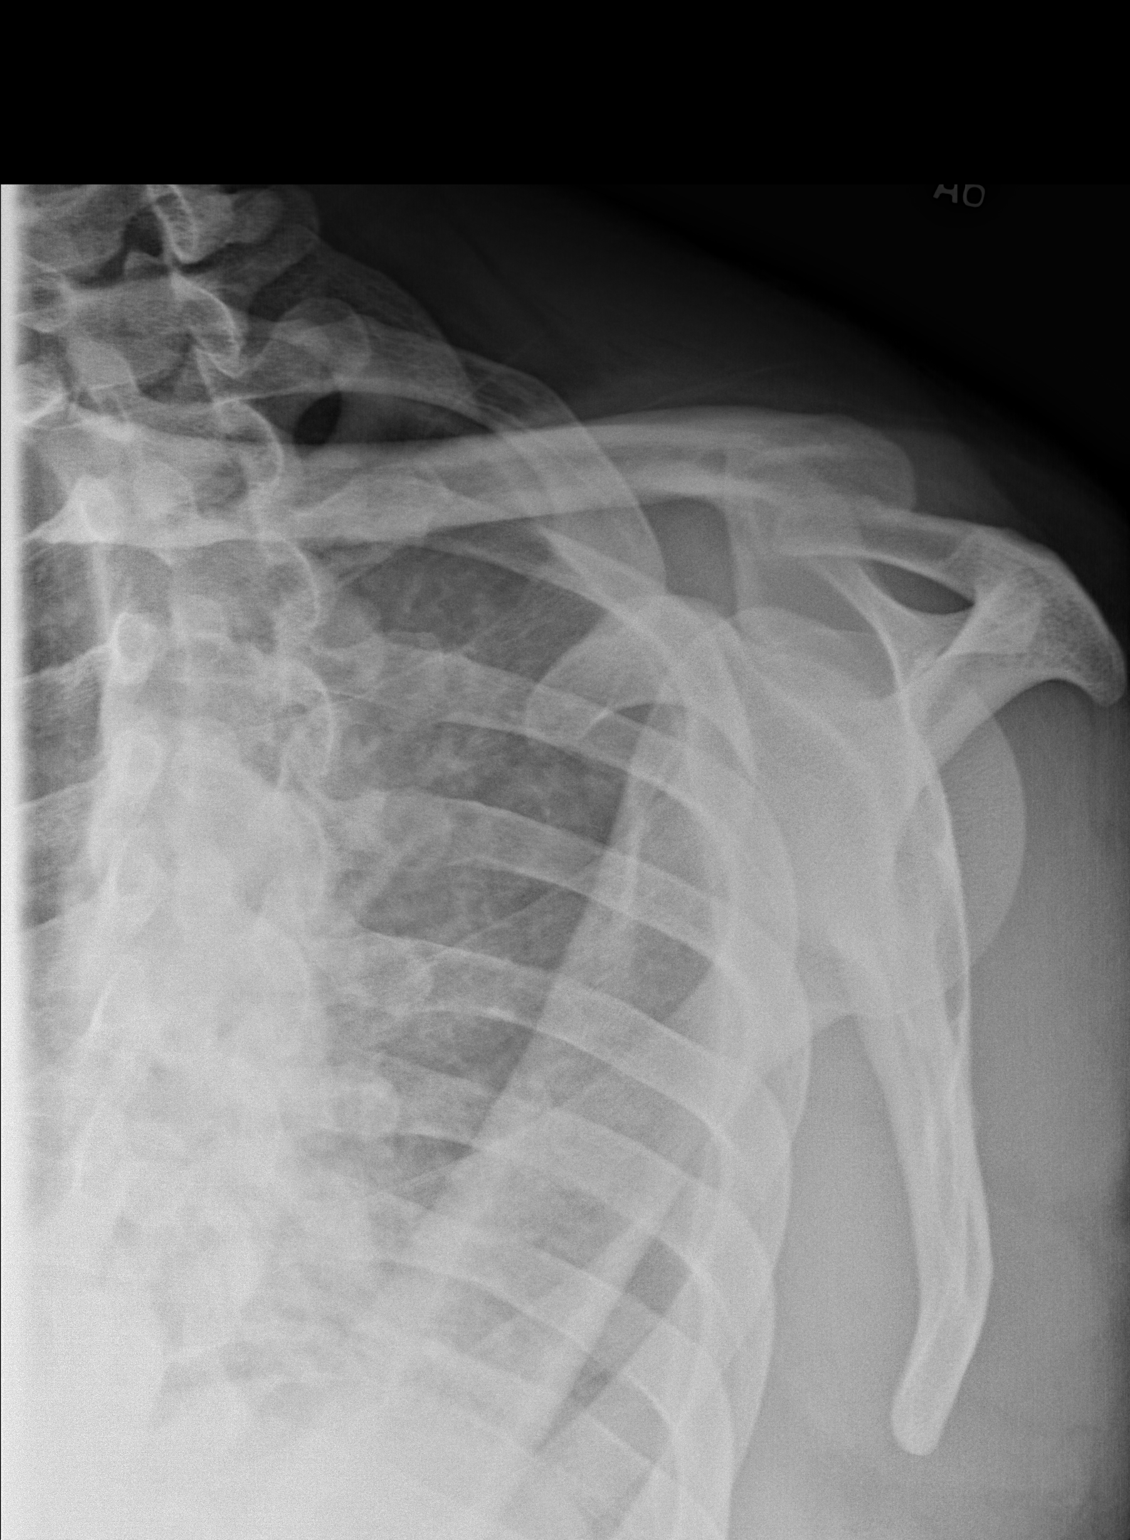

[x shoulder axillary left]
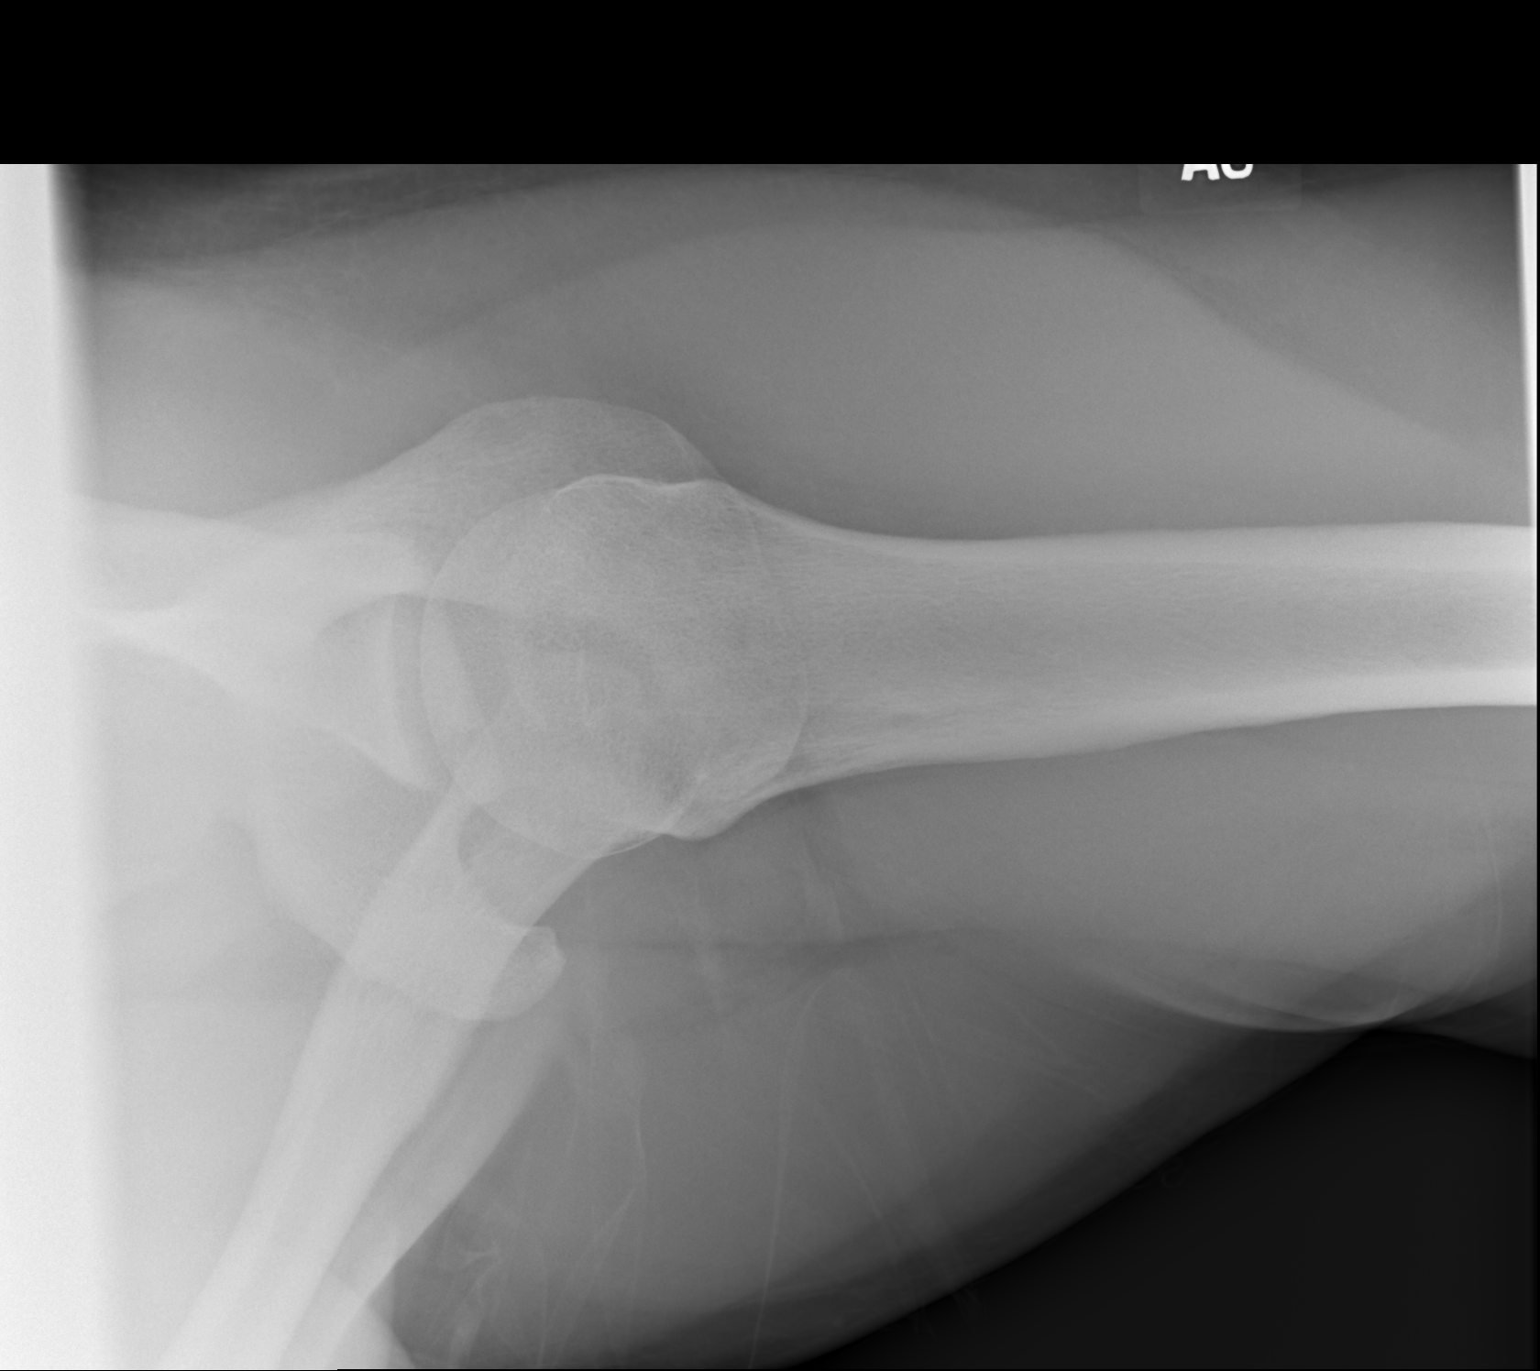

[3 of 3 positions shown; findings below may reference images not displayed]

FINDINGS: No fracture is identified. The humeral head is located. The
acromioclavicular joint is mildly widened at 0.7 cm.
IMPRESSION: Mild widening of the acromioclavicular joint may be due to grade 2
separation. If there is concern for AC separation, additional
imaging with and without weights of the AC joints could be
performed.

Negative for fracture.

## 2018-11-11 ENCOUNTER — Other Ambulatory Visit: Payer: Self-pay | Admitting: Physician Assistant

## 2018-11-11 DIAGNOSIS — I10 Essential (primary) hypertension: Secondary | ICD-10-CM

## 2018-11-25 ENCOUNTER — Other Ambulatory Visit: Payer: Self-pay | Admitting: Physician Assistant

## 2018-11-25 DIAGNOSIS — I10 Essential (primary) hypertension: Secondary | ICD-10-CM

## 2022-02-27 ENCOUNTER — Emergency Department (HOSPITAL_BASED_OUTPATIENT_CLINIC_OR_DEPARTMENT_OTHER): Payer: 59

## 2022-02-27 ENCOUNTER — Emergency Department (HOSPITAL_BASED_OUTPATIENT_CLINIC_OR_DEPARTMENT_OTHER)
Admission: EM | Admit: 2022-02-27 | Discharge: 2022-02-27 | Disposition: A | Discharge status: Home / Self Care | Payer: 59 | Attending: Emergency Medicine | Admitting: Emergency Medicine

## 2022-02-27 ENCOUNTER — Encounter (HOSPITAL_BASED_OUTPATIENT_CLINIC_OR_DEPARTMENT_OTHER): Payer: Self-pay | Admitting: *Deleted

## 2022-02-27 DIAGNOSIS — I10 Essential (primary) hypertension: Secondary | ICD-10-CM | POA: Insufficient documentation

## 2022-02-27 DIAGNOSIS — X500XXA Overexertion from strenuous movement or load, initial encounter: Secondary | ICD-10-CM | POA: Diagnosis not present

## 2022-02-27 DIAGNOSIS — S4991XA Unspecified injury of right shoulder and upper arm, initial encounter: Secondary | ICD-10-CM | POA: Diagnosis present

## 2022-02-27 DIAGNOSIS — S46911A Strain of unspecified muscle, fascia and tendon at shoulder and upper arm level, right arm, initial encounter: Secondary | ICD-10-CM | POA: Diagnosis not present

## 2022-02-27 DIAGNOSIS — Y990 Civilian activity done for income or pay: Secondary | ICD-10-CM | POA: Diagnosis not present

## 2022-02-27 DIAGNOSIS — Z79899 Other long term (current) drug therapy: Secondary | ICD-10-CM | POA: Insufficient documentation

## 2022-02-27 MED ORDER — DICLOFENAC SODIUM 1 % EX GEL: 2.0000 g | Freq: Four times a day (QID) | CUTANEOUS | 0 refills | Status: AC

## 2022-02-27 MED ORDER — LIDOCAINE 5 % EX PTCH
1.0000 | MEDICATED_PATCH | CUTANEOUS | Status: DC
  Administered 2022-02-27: 1 via TRANSDERMAL
  Filled 2022-02-27: qty 1

## 2022-02-27 MED ORDER — METHOCARBAMOL 500 MG PO TABS: 500.0000 mg | ORAL_TABLET | Freq: Two times a day (BID) | ORAL | 0 refills | Status: AC

## 2022-02-27 NOTE — Discharge Instructions (Addendum)
Follow-up with your primary care provider regarding your blood pressure. Discuss shoulder injury with your employer.  Recommend topical pain medication.  Take Robaxin as needed as prescribed, this is a muscle relaxer.  Follow-up with Worker's Comp. provider in 2 days for recheck.  We will need to contact your employer for referral to your Worker's Comp. care team.

## 2022-02-27 NOTE — ED Triage Notes (Addendum)
Has right shoulder pain, "I think I pulled a muscle" was at work when episode occurred. Due to pain, unable to raise rt arm. Has tried OTC meds. CMS of RUE wnl

## 2022-02-27 NOTE — ED Provider Notes (Signed)
[facility] Provider Note   CSN: [id number] Arrival date & time: 02/27/22  8466     History  No chief complaint on file.   Colin Cole is a 51 y.o. male.  51 year old male with right shoulder pain with ROM of the shoulder, onset this morning upon waking. States he thinks he pulled a muscle at work moving [employer], repetitive motion, no specific injury at the time but woke up with pain in the shoulder. Hx HTN, takes meds, did take his meds this morning. Denies weakness or numbness in the effected arm. Reports prior pulled muscle in same shoulder several years ago.       Home Medications Prior to Admission medications   Medication Sig Start Date End Date Taking? Authorizing Provider  diclofenac Sodium (VOLTAREN) 1 % GEL Apply 2 g topically 4 (four) times daily. 02/27/22  Yes Tacy Learn, PA-C  methocarbamol (ROBAXIN) 500 MG tablet Take 1 tablet (500 mg total) by mouth 2 (two) times daily. 02/27/22  Yes Tacy Learn, PA-C  hydrochlorothiazide (HYDRODIURIL) 25 MG tablet Take 1 tablet (25 mg total) by mouth daily. 08/20/17   Argentina Donovan, PA-C  loratadine (CLARITIN) 10 MG tablet Take 10 mg by mouth daily as needed for allergies.    [provider]  terbinafine (LAMISIL) 250 MG tablet Take 1 tablet (250 mg total) by mouth daily. 09/22/16   Boykin Nearing, MD      Allergies    Asa [aspirin] and Terbinafine and related    Review of Systems   Review of Systems Negative except as per HPI Physical Exam Updated Vital Signs BP (!) 195/123 (BP Location: Right Arm)   Pulse 77   Temp 98 F (36.7 C) (Oral)   Resp 18   Ht '6\' 1"'$  (1.854 m)   Wt (!) 140.6 kg   SpO2 99%   BMI 40.90 kg/m  Physical Exam Vitals and nursing note reviewed.  Constitutional:      General: He is not in acute distress.    Appearance: He is well-developed. He is not diaphoretic.  HENT:     Head: Normocephalic and atraumatic.  Cardiovascular:     Pulses:  Normal pulses.  Pulmonary:     Effort: Pulmonary effort is normal.  Musculoskeletal:        General: Tenderness present. No swelling.     Cervical back: Normal range of motion and neck supple.     Comments: Tenderness with palpation along prox biceps tendon, pain with ROM in all fields right shoulder  Skin:    General: Skin is warm and dry.     Findings: No bruising, erythema or rash.  Neurological:     Mental Status: He is alert and oriented to person, place, and time.     Sensory: No sensory deficit.  Psychiatric:        Behavior: Behavior normal.     ED Results / Procedures / Treatments   Labs (all labs ordered are listed, but only abnormal results are displayed) Labs Reviewed - No data to display  EKG None  Radiology DG Shoulder Right  Result Date: 02/27/2022 CLINICAL DATA:  Trauma, pain EXAM: RIGHT SHOULDER - 2+ VIEW COMPARISON:  None Available. FINDINGS: No fracture or dislocation is seen. There are no abnormal soft tissue calcifications. IMPRESSION: No fracture or dislocation is seen in right shoulder. Electronically Signed   By: Elmer Picker M.D.   On: 02/27/2022 09:34    Procedures Procedures  Medications Ordered in ED Medications  lidocaine (LIDODERM) 5 % 1 patch (has no administration in time range)    ED Course/ Medical Decision Making/ A&P                           Medical Decision Making Amount and/or Complexity of Data Reviewed Radiology: ordered.   51 yo male with right shoulder pain, onset upon waking, no specific known injury however suspect secondary to heavy lifting at work yesterday.  Radial pulse present, sensation intact.  Has pain with biceps curl along proximal biceps tendon.  Pain to palpation along biceps tendon.  Pain also reproduced with any motion of the right shoulder, significantly limited range of motion on exam today.  Plan is to obtain x-ray of the right shoulder although discussed likely inflammatory process and doubt  findings on x-ray today.  Advised he will be given a topical pain medication, muscle relaxant, recommend gentle range of motion exercises and advised follow-up with his Worker's Comp. provider for further treatment.  X-ray is ordered interpreted by me as negative for acute bony injury or dislocation.  Agree with radiologist interpretation.        Final Clinical Impression(s) / ED Diagnoses Final diagnoses:  Strain of right shoulder, initial encounter    Rx / DC Orders ED Discharge Orders          Ordered    diclofenac Sodium (VOLTAREN) 1 % GEL  4 times daily        02/27/22 0920    methocarbamol (ROBAXIN) 500 MG tablet  2 times daily        02/27/22 1010              Roque Lias 02/27/22 [place], [place], DO 02/27/22 1406

## 2022-02-27 NOTE — ED Notes (Signed)
States possibly pulled a muscle at work, does a lot of moving and lifting and using RUE a lot. Presents with pain in Rt shoulder area and has limited ROM due to pain. CMS of RUE is WNL. Client does has hx of HTN and did take his meds as prescribed this am.

## 2022-10-07 ENCOUNTER — Encounter: Payer: Self-pay | Admitting: Internal Medicine

## 2022-10-21 ENCOUNTER — Ambulatory Visit: Payer: BLUE CROSS/BLUE SHIELD | Admitting: Gastroenterology

## 2024-07-12 ENCOUNTER — Encounter (INDEPENDENT_AMBULATORY_CARE_PROVIDER_SITE_OTHER): Payer: Self-pay | Admitting: *Deleted
